# Patient Record
Sex: Female | Born: 1950 | Race: White | Marital: Single | Smoking: Never smoker
Health system: Southern US, Community
[De-identification: ages and names within clinical notes are randomized; demographics above are authoritative.]

## PROBLEM LIST (undated history)

## (undated) DIAGNOSIS — J45909 Unspecified asthma, uncomplicated: Secondary | ICD-10-CM

## (undated) HISTORY — DX: Unspecified asthma, uncomplicated: J45.909

## (undated) HISTORY — PX: APPENDECTOMY: SHX54

## (undated) HISTORY — PX: TONSILLECTOMY: SUR1361

## (undated) HISTORY — PX: OVARIAN CYST REMOVAL: SHX89

---

## 2001-01-31 HISTORY — PX: OTHER SURGICAL HISTORY: SHX169

## 2013-07-17 ENCOUNTER — Encounter: Payer: Self-pay | Admitting: Internal Medicine

## 2013-07-30 ENCOUNTER — Ambulatory Visit (AMBULATORY_SURGERY_CENTER): Payer: Medicare HMO

## 2013-07-30 VITALS — Ht 60.75 in | Wt 147.4 lb

## 2013-07-30 DIAGNOSIS — Z1211 Encounter for screening for malignant neoplasm of colon: Secondary | ICD-10-CM

## 2013-07-30 MED ORDER — MOVIPREP 100 G PO SOLR: 1.0000 | Freq: Once | ORAL | Status: DC

## 2013-07-30 NOTE — Progress Notes (Signed)
No allergies to eggs or soy No home oxygen No past problems with anesthesia No diet/weight loss meds  Has email but no internet access

## 2013-08-01 ENCOUNTER — Encounter: Payer: Self-pay | Admitting: Internal Medicine

## 2013-08-13 ENCOUNTER — Encounter: Payer: Self-pay | Admitting: Internal Medicine

## 2015-01-07 ENCOUNTER — Encounter: Payer: Self-pay | Admitting: Internal Medicine

## 2015-04-02 ENCOUNTER — Other Ambulatory Visit: Payer: Managed Care, Other (non HMO)

## 2015-04-02 ENCOUNTER — Encounter: Payer: Self-pay | Admitting: Internal Medicine

## 2015-04-02 ENCOUNTER — Ambulatory Visit (INDEPENDENT_AMBULATORY_CARE_PROVIDER_SITE_OTHER): Payer: Managed Care, Other (non HMO) | Admitting: Internal Medicine

## 2015-04-02 VITALS — BP 140/88 | HR 73 | Ht 61.0 in | Wt 156.2 lb

## 2015-04-02 DIAGNOSIS — J4540 Moderate persistent asthma, uncomplicated: Secondary | ICD-10-CM | POA: Diagnosis not present

## 2015-04-02 DIAGNOSIS — J329 Chronic sinusitis, unspecified: Secondary | ICD-10-CM | POA: Insufficient documentation

## 2015-04-02 DIAGNOSIS — R0989 Other specified symptoms and signs involving the circulatory and respiratory systems: Secondary | ICD-10-CM

## 2015-04-02 LAB — NITRIC OXIDE: NITRIC OXIDE: 15

## 2015-04-02 NOTE — Progress Notes (Signed)
Subjective:    Patient ID: Stacey Hill, female    DOB: [birth year], 65 y.o.   MRN: [id number]  PCP Hoyle Sauer, MD   HPI  IOV 04/02/2015  Chief Complaint  Patient presents with  . Pulmonary Consult    Pt referred by Dr. Felipa Eth for asthma. Pt states she had 2 episodes of bronchitis from April to June. Pt c/o DOE, mild dry cough, sinus congestion.      Stacey Hill is a 65 y.o. female originally from [place]. She has been referred for asthma that has a history of difficult control and recurrent exacerbations. She reports that she was born and brought up in [place] all her life. In the [place]. In her 84s she had acute episode of bronchitis and then had asthma for a few years. Then to her 30s and 40s she was asthma 3. Subsequently in 2000 30 2004 in her late 72s she had respiratory difficulty for several weeks which then resulted in a one-week emergency hospitalization for acute asthma. After that she was placed on triple inhaler therapy including Advair and Spiriva. However despite this she is to have recurrent asthma exacerbations at least requiring 3 episodes of prednisone each year and 405 antibiotic courses each year. But she never got hospitalized. All these were treated emergently through her primary care physician in [place]. Subsequently in 2012 she moved to [place] to live near her brother. She initially had some stretches without health insurance during which she did not take any of her maintenance inhalers but she felt her asthma was initially better. Subsequently she established with Dr. Felipa Eth and has been on Symbicort. However 2016 turned out to be a bad idea and she had exacerbations in April and November 2016. [facility]. I review those records and the exams clearly demonstrated she had bilateral significant wheezing. She was treated with antibiotic and prednisone each time. She now decided she wanted to see a pulmonologist.    Exhaled nitric oxide  today is 15. She says at this point in time she feels that her asthma is under control. She has associated chronic sinus drainage which she says for most people in the severe cold but for her is actually baseline and is normal and she is adapted to it. Asthma control 5. questionnaire today shows an average score cough 0.8 suggesting good control. Specifically she is not waking up in the middle of the night because of asthma. When she wakes up she only has mild symptoms. She is not limited at all in her activities because of asthma. She does get a very little shortness of breath with activities. She is not wheezing. Is not using albuterol for rescue.    Walking desaturation test 185 feet 3 laps on room air: Did not desaturate.    has a past medical history of Asthma.   reports that she has never smoked. She has never used smokeless tobacco.  Past Surgical History  Procedure Laterality Date  . Ovarian cyst removal  1967 1973    x2  . Torn tendon left ankle  2003    age 24  . Tonsillectomy    . Appendectomy      Allergies  Allergen Reactions  . Percocet [Oxycodone-Acetaminophen] Nausea And Vomiting    Immunization History  Administered Date(s) Administered  . Influenza,inj,Quad PF,36+ Mos 01/01/2015    Family History  Problem Relation Age of Onset  . Colon cancer Neg Hx   . Heart  disease Father      Current outpatient prescriptions:  .  albuterol (PROVENTIL HFA;VENTOLIN HFA) 108 (90 Base) MCG/ACT inhaler, Inhale 1-2 puffs into the lungs every 6 (six) hours as needed for wheezing or shortness of breath., Disp: , Rfl:  .  budesonide-formoterol (SYMBICORT) 160-4.5 MCG/ACT inhaler, Inhale 2 puffs into the lungs 2 (two) times daily., Disp: , Rfl:  .  diphenhydrAMINE (BENADRYL) 25 mg capsule, Take 25 mg by mouth every 6 (six) hours as needed., Disp: , Rfl:  .  fluticasone (FLONASE) 50 MCG/ACT nasal spray, Place 2 sprays into both nostrils daily., Disp: , Rfl:       Review of  Systems  Constitutional: Negative for fever and unexpected weight change.  HENT: Positive for congestion. Negative for dental problem, ear pain, nosebleeds, postnasal drip, rhinorrhea, sinus pressure, sneezing, sore throat and trouble swallowing.   Eyes: Negative for redness and itching.  Respiratory: Positive for cough and shortness of breath. Negative for chest tightness and wheezing.   Cardiovascular: Negative for palpitations and leg swelling.  Gastrointestinal: Negative for nausea and vomiting.  Genitourinary: Negative for dysuria.  Musculoskeletal: Negative for joint swelling.  Skin: Negative for rash.  Neurological: Negative for headaches.  Hematological: Does not bruise/bleed easily.  Psychiatric/Behavioral: Negative for dysphoric mood. The patient is not nervous/anxious.        Objective:   Physical Exam  Constitutional: She is oriented to person, place, and time. She appears well-developed and well-nourished. No distress.  HENT:  Head: Normocephalic and atraumatic.  Right Ear: External ear normal.  Left Ear: External ear normal.  Mouth/Throat: Oropharynx is clear and moist. No oropharyngeal exudate.  Eyes: Conjunctivae and EOM are normal. Pupils are equal, round, and reactive to light. Right eye exhibits no discharge. Left eye exhibits no discharge. No scleral icterus.  Neck: Normal range of motion. Neck supple. No JVD present. No tracheal deviation present. No thyromegaly present.  Cardiovascular: Normal rate, regular rhythm, normal heart sounds and intact distal pulses.  Exam reveals no gallop and no friction rub.   No murmur heard. Pulmonary/Chest: Effort normal. No respiratory distress. She has no wheezes. She has rales. She exhibits no tenderness.  She has definite bilateral bibasal crackles. This has not been described before  Abdominal: Soft. Bowel sounds are normal. She exhibits no distension and no mass. There is no tenderness. There is no rebound and no guarding.    Musculoskeletal: Normal range of motion. She exhibits no edema or tenderness.  Lymphadenopathy:    She has no cervical adenopathy.  Neurological: She is alert and oriented to person, place, and time. She has normal reflexes. No cranial nerve deficit. She exhibits normal muscle tone. Coordination normal.  Skin: Skin is warm and dry. No rash noted. She is not diaphoretic. No erythema. No pallor.  Psychiatric: She has a normal mood and affect. Her behavior is normal. Judgment and thought content normal.  Vitals reviewed.   Filed Vitals:   04/02/15 1555  BP: 140/88  Pulse: 73  Height:  (1.549 m)  Weight: 156 lb 3.2 oz (70.852 kg)  SpO2: 97%         Assessment & Plan:     ICD-9-CM ICD-10-CM   1. Asthma, moderate persistent, uncomplicated 493.90 J45.40   2. Chronic sinusitis, unspecified location 473.9 J32.9   3. Bibasilar crackles 786.7 R09.89    Clinically she has asthma. Based on today's asthma control question and exhaled nitric oxide this is under control on Symbicort. She has a history  of recurrent exacerbations. She also has a history of poor to control asthma. In this context, I would like to rule out ABPA, chronic sinus disease, allergy Asthma or alpha-1 antitrypsin deficiency or associated strong eosinophilia  In addition the crackles make me concerned that she might have underlying interstitial lung disease although the rest of the history does not fit in with this pattern  PLAN As of 04/02/2015 asthma appears well controlled However. Need to understand if you have asthma associated problems or risk factors for recurrent flare up  PLAN  - do alpha 1 AT genetic test  - do total IgE - do RAST blood allergy panel  - do cbc with diff  - do Aspergillus IgE and IgG - do HRCT wo contrast - do CT sinus wo contrast  FU  -return next few week after above  Dr. Kalman Shan, M.D., [facility].C.P Pulmonary and Critical Care Medicine Staff Physician [place]  [facility] [place] [facility] Pager: [phone], If no answer or between  15:00h - 7:00h: call [phone]  04/02/2015 4:47 PM

## 2015-04-02 NOTE — Patient Instructions (Signed)
ICD-9-CM ICD-10-CM   1. Asthma, moderate persistent, uncomplicated 493.90 J45.40   2. Chronic sinusitis, unspecified location 473.9 J32.9   3. Bibasilar crackles 786.7 R09.89     As of 04/02/2015 asthma appears well controlled However. Need to understand if you have asthma associated problems or risk factors for recurrent flare up  PLAN  - do alpha 1 AT genetic test  - do total IgE - do RAST blood allergy panel  - do cbc with diff  - do Aspergillus IgE and IgG - do HRCT wo contrast - do CT sinus wo contrast

## 2015-04-03 LAB — RESPIRATORY ALLERGY PROFILE REGION II ~~LOC~~
ALTERNARIA ALTERNATA: 0.47 kU/L — AB
Allergen, Cottonwood, t14: <0.1 kU/L
Allergen, D pternoyssinus,d7: <0.1 kU/L
Allergen, Mulberry, t76: <0.1 kU/L
Bermuda Grass: <0.1 kU/L
Cat Dander: 0.44 kU/L — ABNORMAL HIGH
Cladosporium Herbarum: <0.1 kU/L
Cockroach: <0.1 kU/L
D. farinae: <0.1 kU/L
Dog Dander: <0.1 kU/L
Elm IgE: <0.1 kU/L
IGE (IMMUNOGLOBULIN E), SERUM: 260 kU/L — AB (ref <115)
Johnson Grass: <0.1 kU/L

## 2015-04-06 ENCOUNTER — Telehealth: Payer: Self-pay | Admitting: Internal Medicine

## 2015-04-06 NOTE — Telephone Encounter (Signed)
I scheduled pt for her CT Maxillofacial and CT Chest High Resolution on 04/07/15.  However, when I tried to get the preauthorization from patient's insurance I was told that patient would need to contact them before they could give us any preauthorization information.  According to the insurance co, pt still had old demographics on file and they needed pt to call and update her info before they could determine if preauthorization was needed.  I spoke pt on 04/03/15 and advised that her insurance co needed her to contact them to update her demographics.  Patient stated she would contact them and call me back.  Later on 04/03/15 pt left a voice mail on my phone stating she wanted to cancel both of the CTs but did not state why. I spoke with Misty StanleyStacey @[facility]  and cancelled both CTs for the pt.   I called and spoke with the patient this morning (04/06/15) and asked if she would like for us to reschedule one or both CT's for her.  Pt stated she did not wish to reschedule either CT because there were issues involving her insurance co and that neither of these CTs was medically necessary anyway.

## 2015-04-07 ENCOUNTER — Inpatient Hospital Stay: Admission: RE | Admit: 2015-04-07 | Payer: Managed Care, Other (non HMO) | Source: Ambulatory Visit

## 2015-04-07 ENCOUNTER — Other Ambulatory Visit: Payer: Managed Care, Other (non HMO)

## 2015-04-07 LAB — ASPERGILLUS IGE PANEL
Aspergillus Amstel/glauc IgE: <0.35 kU/L (ref <0.35)
Aspergillus Terreus IgE: <0.35 kU/L (ref <0.35)
Aspergillus Versicolor IgE: <0.1 kU/L (ref <0.35)
Aspergillus flavus IgE: <0.35 kU/L (ref <0.35)
CLASS: 0
CLASS: 0
CLASS: 0
CLASS: 0
Class: 0
Class: 0
Class: 0

## 2015-04-08 LAB — ALPHA-1 ANTITRYPSIN PHENOTYPE: A-1 Antitrypsin: 132 mg/dL (ref 83–199)

## 2015-04-09 NOTE — Telephone Encounter (Addendum)
1) noted issues with CT scan - please tell her that we can manage without that data for now  2) blood work - all normal except mild alelrgies to cat dander and alternaria a fungi. This results in mild elevatin of IgE an alelrgy antibody  My rec - change appt to 3 months  - we will follow along - she is to involve us as first line contact for any resp issues  - over time we see repeated flare ups then will consider allergy referral v xolair - does she have a cat? How much does she love the cat?  Dr. Kalman ShanMurali Janah Mcculloh, M.D., [facility]C.P Pulmonary and Critical Care Medicine Staff Physician [place] [facility] [place] [facility] Pager: [phone], If no answer or between  15:00h - 7:00h: call [phone]  04/09/2015 3:24 PM

## 2015-04-13 NOTE — Telephone Encounter (Signed)
lmtcb for pt.  

## 2015-04-14 ENCOUNTER — Telehealth: Payer: Self-pay | Admitting: Internal Medicine

## 2015-04-14 NOTE — Telephone Encounter (Signed)
Patient is returning call, CB is [phone].

## 2015-04-14 NOTE — Telephone Encounter (Signed)
Per 3.14.17 pt has been informed. Will sign off.

## 2015-04-14 NOTE — Telephone Encounter (Signed)
See phone note 04/06/15.  Spoke with pt and advised of Dr Jane Canaryamaswamy's recommendations.  PT verbalized understanding.  Appt scheduled for 3 mth f/u.

## 2015-04-26 ENCOUNTER — Ambulatory Visit (INDEPENDENT_AMBULATORY_CARE_PROVIDER_SITE_OTHER): Payer: Managed Care, Other (non HMO) | Admitting: Physician Assistant

## 2015-04-26 ENCOUNTER — Ambulatory Visit (INDEPENDENT_AMBULATORY_CARE_PROVIDER_SITE_OTHER): Payer: Managed Care, Other (non HMO)

## 2015-04-26 VITALS — BP 108/78 | HR 87 | Temp 99.8°F | Resp 18 | Ht 61.5 in | Wt 151.0 lb

## 2015-04-26 DIAGNOSIS — R059 Cough, unspecified: Secondary | ICD-10-CM

## 2015-04-26 DIAGNOSIS — R05 Cough: Secondary | ICD-10-CM | POA: Diagnosis not present

## 2015-04-26 DIAGNOSIS — Z8709 Personal history of other diseases of the respiratory system: Secondary | ICD-10-CM

## 2015-04-26 LAB — POCT CBC
GRANULOCYTE PERCENT: 62.7 % (ref 37–80)
HEMATOCRIT: 42.9 % (ref 37.7–47.9)
Hemoglobin: 15.4 g/dL (ref 12.2–16.2)
Lymph, poc: 1.7 (ref 0.6–3.4)
MCH: 32.1 pg — AB (ref 27–31.2)
MCHC: 36 g/dL — AB (ref 31.8–35.4)
MCV: 89.3 fL (ref 80–97)
MID (CBC): 0.6 (ref 0–0.9)
MPV: 7.6 fL (ref 0–99.8)
POC GRANULOCYTE: 3.8 (ref 2–6.9)
POC LYMPH %: 27.7 % (ref 10–50)
POC MID %: 9.6 %M (ref 0–12)
Platelet Count, POC: 192 10*3/uL (ref 142–424)
RBC: 4.8 M/uL (ref 4.04–5.48)
RDW, POC: 13.8 %
WBC: 6 10*3/uL (ref 4.6–10.2)

## 2015-04-26 MED ORDER — PREDNISONE 20 MG PO TABS: ORAL_TABLET | ORAL | Status: AC

## 2015-04-26 NOTE — Patient Instructions (Signed)
     IF you received an x-ray today, you will receive an invoice from [facility]. Please contact [facility] at [phone] with questions or concerns regarding your invoice.   IF you received labwork today, you will receive an invoice from [facility]. Please contact [facility] at [phone] with questions or concerns regarding your invoice.   Our billing staff will not be able to assist you with questions regarding bills from these companies.  You will be contacted with the lab results as soon as they are available. The fastest way to get your results is to activate your My Chart account. Instructions are located on the last page of this paperwork. If you have not heard from us regarding the results in 2 weeks, please contact this office.      

## 2015-04-26 NOTE — Progress Notes (Signed)
04/26/2015 10:22 AM   DOB: 1950-10-26 / MRN: [id number]  SUBJECTIVE:  Stacey Hill is a 65 y.o. female with a history of asthma presenting for cough that started yesterday.  Associates mild headache and back ache that started yesterday, both of which have resolved today. Reports one episode of rigor this morning, and she took Aleve and this resolved.     She is allergic to percocet.   She  has a past medical history of Asthma.    She  reports that she has never smoked. She has never used smokeless tobacco. She reports that she drinks alcohol. She reports that she does not use illicit drugs. She  has no sexual activity history on file. The patient  has past surgical history that includes Ovarian cyst removal (1967 1973); torn tendon left ankle (2003); Tonsillectomy; and Appendectomy.  Her family history includes Heart disease in her father. There is no history of Colon cancer.  Review of Systems  Constitutional: Positive for chills and malaise/fatigue. Negative for fever and diaphoresis.  Respiratory: Negative for cough.   Cardiovascular: Negative for chest pain.  Gastrointestinal: Negative for nausea.  Skin: Negative for rash.  Neurological: Negative for dizziness and headaches.    Problem list and medications reviewed and updated by myself where necessary, and exist elsewhere in the encounter.   OBJECTIVE:  BP 108/78 mmHg  Pulse 87  Temp(Src) 99.8 F (37.7 C)  Resp 18  Ht 5' 1.5" (1.562 m)  Wt 151 lb (68.493 kg)  BMI 28.07 kg/m2  SpO2 95%  Physical Exam  Constitutional: She is oriented to person, place, and time. She appears well-nourished. No distress.  Eyes: EOM are normal. Pupils are equal, round, and reactive to light.  Cardiovascular: Normal rate.   Pulmonary/Chest: Effort normal. She has no wheezes. She has no rales.  Abdominal: She exhibits no distension.  Musculoskeletal: Normal range of motion.  Neurological: She is alert and oriented to person, place, and time.  No cranial nerve deficit. Gait normal.  Skin: Skin is dry. She is not diaphoretic.  Psychiatric: She has a normal mood and affect.  Vitals reviewed.   Results for orders placed or performed in visit on 04/26/15 (from the past 72 hour(s))  POCT CBC     Status: Abnormal   Collection Time: 04/26/15  9:49 AM  Result Value Ref Range   WBC 6.0 4.6 - 10.2 K/uL   Lymph, poc 1.7 0.6 - 3.4   POC LYMPH PERCENT 27.7 10 - 50 %L   MID (cbc) 0.6 0 - 0.9   POC MID % 9.6 0 - 12 %M   POC Granulocyte 3.8 2 - 6.9   Granulocyte percent 62.7 37 - 80 %G   RBC 4.80 4.04 - 5.48 M/uL   Hemoglobin 15.4 12.2 - 16.2 g/dL   HCT, POC 47.8 29.5 - 47.9 %   MCV 89.3 80 - 97 fL   MCH, POC 32.1 (A) 27 - 31.2 pg   MCHC 36.0 (A) 31.8 - 35.4 g/dL   RDW, POC 62.1 %   Platelet Count, POC 192 142 - 424 K/uL   MPV 7.6 0 - 99.8 fL     Dg Chest 2 View  04/26/2015  CLINICAL DATA:  Cough, known asthma EXAM: CHEST  2 VIEW COMPARISON:  None. FINDINGS: Normal mediastinum and cardiac silhouette. Lungs are hyperinflated. Normal pulmonary vasculature. No evidence of effusion, infiltrate, or pneumothorax. No acute bony abnormality. IMPRESSION: Hyperinflated lungs.  No acute findings. Electronically Signed  By: Genevive BiStewart  Edmunds M.D.   On: 04/26/2015 10:10    ASSESSMENT AND PLAN  Aurea GraffJoan was seen today for cough.  Diagnoses and all orders for this visit:  Cough: Her HPI, exam and workup are reassuring.  This is most likely a resolving influenza.  Given her history of asthma will treat to reduce bronchospasm.  RTC in 24-48 hours if no improvement or symptoms change.  -     POCT CBC -     DG Chest 2 View; Future  History of asthma  Other orders -     predniSONE (DELTASONE) 20 MG tablet; Take 3 in the morning for 3 days, then 2 in the morning for 3 days, and then 1 in the morning for 3 days.    The patient was advised to call or return to clinic if she does not see an improvement in symptoms or to seek the care of the closest  emergency department if she worsens with the above plan.   Deliah BostonMichael Clark, MHS, PA-C [facility] 04/26/2015 10:22 AM

## 2015-06-22 ENCOUNTER — Ambulatory Visit (INDEPENDENT_AMBULATORY_CARE_PROVIDER_SITE_OTHER): Payer: Managed Care, Other (non HMO) | Admitting: Adult Health

## 2015-06-22 ENCOUNTER — Encounter: Payer: Self-pay | Admitting: Adult Health

## 2015-06-22 VITALS — BP 150/92 | HR 80 | Temp 98.5°F | Ht 61.0 in | Wt 153.0 lb

## 2015-06-22 DIAGNOSIS — J4541 Moderate persistent asthma with (acute) exacerbation: Secondary | ICD-10-CM | POA: Diagnosis not present

## 2015-06-22 MED ORDER — CEFDINIR 300 MG PO CAPS: 300.0000 mg | ORAL_CAPSULE | Freq: Two times a day (BID) | ORAL | Status: DC

## 2015-06-22 NOTE — Assessment & Plan Note (Signed)
Flare with bronchitis   Plan  Finish Prednisone taper .  Begin Omnicef 300mg  Twice daily  For 1 week. Take with food.  Mucinex DM Twice daily  As needed  Cough/congestion  Fluids and rest  Please contact office for sooner follow up if symptoms do not improve or worsen or seek emergency care   follow up Dr. Marchelle Gearingamaswamy next month and As needed

## 2015-06-22 NOTE — Progress Notes (Signed)
Subjective:    Patient ID: Stacey Hill, female    DOB: 1950-07-01, 65 y.o.   MRN: [id number]  HPI 65 yo female never smoker with asthma   06/22/2015 [facility]  Pt presents for a follow up from recent urgent care visit.  Seen on 5/19 for 1 week of cough, congestion, wheezing, SOB, chest tightness/congestion, prod cough with light yellow sticky mucus.  Denies any sinus pressure/drainage, fever, chills, nausea or vomiting.  [facility]19/15 . Given prednisone taper. She is feeling better but still congested.  Remains on Symbicort.     Past Medical History  Diagnosis Date  . Asthma    Current Outpatient Prescriptions on File Prior to Visit  Medication Sig Dispense Refill  . albuterol (PROVENTIL HFA;VENTOLIN HFA) 108 (90 Base) MCG/ACT inhaler Inhale 1-2 puffs into the lungs every 6 (six) hours as needed for wheezing or shortness of breath.    . budesonide-formoterol (SYMBICORT) 160-4.5 MCG/ACT inhaler Inhale 2 puffs into the lungs 2 (two) times daily.    . diphenhydrAMINE (BENADRYL) 25 mg capsule Take 25 mg by mouth every 6 (six) hours as needed.    . fluticasone (FLONASE) 50 MCG/ACT nasal spray Place 2 sprays into both nostrils daily.     No current facility-administered medications on file prior to visit.      Review of Systems Constitutional:   No  weight loss, night sweats,  Fevers, chills, fatigue, or  lassitude.  HEENT:   No headaches,  Difficulty swallowing,  Tooth/dental problems, or  Sore throat,                No sneezing, itching, ear ache, nasal congestion, post nasal drip,   CV:  No chest pain,  Orthopnea, PND, swelling in lower extremities, anasarca, dizziness, palpitations, syncope.   GI  No heartburn, indigestion, abdominal pain, nausea, vomiting, diarrhea, change in bowel habits, loss of appetite, bloody stools.   Resp: No shortness of breath with exertion or at rest.  No excess mucus, no productive cough,  No non-productive cough,  No  coughing up of blood.  No change in color of mucus.  No wheezing.  No chest wall deformity  Skin: no rash or lesions.  GU: no dysuria, change in color of urine, no urgency or frequency.  No flank pain, no hematuria   MS:  No joint pain or swelling.  No decreased range of motion.  No back pain.  Psych:  No change in mood or affect. No depression or anxiety.  No memory loss.         Objective:   Physical Exam  Filed Vitals:   06/22/15 0926  Pulse: 80  Temp: 98.5 F (36.9 C)  TempSrc: Oral  Height: 5\' 1"  (1.549 m)  Weight: 153 lb (69.4 kg)  SpO2: 92%    GEN: A/Ox3; pleasant , NAD, well nourished   HEENT:  [place]/AT,  EACs-clear, TMs-wnl, NOSE-clear, THROAT-clear, no lesions, no postnasal drip or exudate noted.   NECK:  Supple w/ fair ROM; no JVD; normal carotid impulses w/o bruits; no thyromegaly or nodules palpated; no lymphadenopathy.no stridor .  RESP  Few trace rhonchi and exp wheezing .no accessory muscle use, no dullness to percussion Speaking in full sentences   CARD:  RRR, no m/r/g  , no peripheral edema, pulses intact, no cyanosis or clubbing.  GI:   Soft & nt; nml bowel sounds; no organomegaly or masses detected.  Musco: Warm bil, no deformities or joint swelling noted.  Neuro: alert, no focal deficits noted.    Skin: Warm, no lesions or rashes  Stacey Marucci NP-C  [place] [facility]  06/22/2015      Assessment & Plan:

## 2015-06-22 NOTE — Patient Instructions (Signed)
Finish Prednisone taper .  Begin Omnicef 300mg  Twice daily  For 1 week. Take with food.  Mucinex DM Twice daily  As needed  Cough/congestion  Fluids and rest  Please contact office for sooner follow up if symptoms do not improve or worsen or seek emergency care   follow up Dr. Marchelle Gearingamaswamy next month and As needed

## 2015-06-23 ENCOUNTER — Telehealth: Payer: Self-pay | Admitting: Internal Medicine

## 2015-06-23 MED ORDER — DOXYCYCLINE HYCLATE 100 MG PO TABS: 100.0000 mg | ORAL_TABLET | Freq: Two times a day (BID) | ORAL | Status: DC

## 2015-06-23 MED ORDER — PREDNISONE 10 MG PO TABS: ORAL_TABLET | ORAL | Status: DC

## 2015-06-23 NOTE — Telephone Encounter (Signed)
Patient states that Zpak has never worked for her.  She is requesting a stronger antibiotic. Dr. Craige CottaSood, please advise.

## 2015-06-23 NOTE — Telephone Encounter (Signed)
Pt returning call.Stacey Hill ° °

## 2015-06-23 NOTE — Telephone Encounter (Signed)
Send doxycycline 100 mg bid, #14 with no refills.

## 2015-06-23 NOTE — Telephone Encounter (Signed)
Change from cefdinir to Zpak.    Send script for prednisone 10 mg daily >> 3 pills daily for 2 days, 2 pills daily for 2 days, 1 pill daily for 2 days.

## 2015-06-23 NOTE — Telephone Encounter (Signed)
Patient notified of Dr. Sood's recommendations. Rx sent to pharmacy. Nothing further needed.  

## 2015-06-23 NOTE — Telephone Encounter (Signed)
Patient states that she is not feeling any better.  She said that she is coughing a lot, coughing out thick yellow mucus, not as thick as it was before, but still thick.  She is taking Mucinex DM to try to loosen the mucus, she said that she is taking Cefdinir, it is giving her Diarrhea. Patient said that she has taken Levaquin several times and she doesn't want to take it again because she doesn't want to develop a resistance to Levaquin. She only has 2 Prednisone tablets left.  Patient says that she had to sleep in the chair because she had a choking sensation when she lays down. She is concerned because she is not better after 1 week.  Patient states that she is using a lot of her albuterol neb medication and will need a refill on this as well.   [facility] [address]  Allergies  Allergen Reactions  . Penicillins Other (See Comments)    Told as a child  . Percocet [Oxycodone-Acetaminophen] Nausea And Vomiting

## 2015-06-23 NOTE — Telephone Encounter (Signed)
lmomtcb for pt.  After speaking with pt, will send in rx.

## 2015-07-07 ENCOUNTER — Ambulatory Visit (INDEPENDENT_AMBULATORY_CARE_PROVIDER_SITE_OTHER): Payer: Managed Care, Other (non HMO) | Admitting: Internal Medicine

## 2015-07-07 ENCOUNTER — Encounter: Payer: Self-pay | Admitting: Internal Medicine

## 2015-07-07 VITALS — BP 124/78 | HR 80 | Ht 61.0 in | Wt 153.0 lb

## 2015-07-07 DIAGNOSIS — J4540 Moderate persistent asthma, uncomplicated: Secondary | ICD-10-CM | POA: Diagnosis not present

## 2015-07-07 NOTE — Progress Notes (Signed)
Subjective:     Patient ID: Stacey Hill, female   DOB: Jun 28, 1950, 65 y.o.   MRN: [id number]  HPI  PCP Hoyle Sauer, MD   HPI  IOV 04/02/2015  Chief Complaint  Patient presents with  . Pulmonary Consult    Pt referred by Dr. Felipa Eth for asthma. Pt states she had 2 episodes of bronchitis from April to June. Pt c/o DOE, mild dry cough, sinus congestion.      Stacey Hill is a 65 y.o. female originally from [place]. She has been referred for asthma that has a history of difficult control and recurrent exacerbations. She reports that she was born and brought up in [place] all her life. In the [place]. In her 9s she had acute episode of bronchitis and then had asthma for a few years. Then to her 30s and 40s she was asthma 3. Subsequently in 2000 30 2004 in her late 25s she had respiratory difficulty for several weeks which then resulted in a one-week emergency hospitalization for acute asthma. After that she was placed on triple inhaler therapy including Advair and Spiriva. However despite this she is to have recurrent asthma exacerbations at least requiring 3 episodes of prednisone each year and 405 antibiotic courses each year. But she never got hospitalized. All these were treated emergently through her primary care physician in [place]. Subsequently in 2012 she moved to [place] to live near her brother. She initially had some stretches without health insurance during which she did not take any of her maintenance inhalers but she felt her asthma was initially better. Subsequently she established with Dr. Felipa Eth and has been on Symbicort. However 2016 turned out to be a bad idea and she had exacerbations in April and November 2016. [facility]. I review those records and the exams clearly demonstrated she had bilateral significant wheezing. She was treated with antibiotic and prednisone each time. She now decided she wanted to see a pulmonologist.    Exhaled nitric oxide  today is 15. She says at this point in time she feels that her asthma is under control. She has associated chronic sinus drainage which she says for most people in the severe cold but for her is actually baseline and is normal and she is adapted to it. Asthma control 5. questionnaire today shows an average score cough 0.8 suggesting good control. Specifically she is not waking up in the middle of the night because of asthma. When she wakes up she only has mild symptoms. She is not limited at all in her activities because of asthma. She does get a very little shortness of breath with activities. She is not wheezing. Is not using albuterol for rescue.   Walking desaturation test 185 feet 3 laps on room air: Did not desaturate.  RECX o alpha 1 AT genetic test  - do total IgE - do RAST blood allergy panel  - do cbc with diff  - do Aspergillus IgE and IgG - do HRCT wo contrast - do CT sinus wo contrast   06/22/2015 Urgent Care  Pt presents for a follow up from recent urgent care visit.  Seen on 5/19 for 1 week of cough, congestion, wheezing, SOB, chest tightness/congestion, prod cough with light yellow sticky mucus.  Denies any sinus pressure/drainage, fever, chills, nausea or vomiting.  [facility]19/15 . Given prednisone taper. She is feeling better but still congested.  Remains on Symbicort.   REC abx/pred  OV 07/07/2015  Chief Complaint  Patient presents with  . Follow-up    Pt doing well since 5/22 acute OV with TP- does note she is still clearing her throat constantly, some PND, producing thick white/clear mucus.    Asthma follow-up possible other differential diagnoses such as irritable larynx syndrome  - Since last visit was end of May 2017 at asthma exacerbation which required changing antibiotics and prolonged prednisone. She says symptom severity was extremely high that resulted in urgent care visit. Currently back to baseline. She is on Symbicort for  which she does not have a co-pay but she is willing to try Brio. She still has a lot of clearing of the throat. . Not much nocturnal awakening no rescue albuterol use currently   Labs from March 2015 include normal nitric oxide, alpha-1 antitrypsin phenotype MM blood allergy panel essentially normal except for slight elevation in And her and slightly elevated IgE to 260. Eosinophilia was not tested with the lab  She could not undergo CT chest and sinus for insurance reasons but a chest x-ray on this time is just hyperinflated and clear.   has a past medical history of Asthma.   reports that she has never smoked. She has never used smokeless tobacco.  Past Surgical History  Procedure Laterality Date  . Ovarian cyst removal  1967 1973    x2  . Torn tendon left ankle  2003    age 70  . Tonsillectomy    . Appendectomy      Allergies  Allergen Reactions  . Cefdinir Diarrhea  . Penicillins Other (See Comments)    Told as a child  . Percocet [Oxycodone-Acetaminophen] Nausea And Vomiting    Immunization History  Administered Date(s) Administered  . Influenza,inj,Quad PF,36+ Mos 01/01/2015    Family History  Problem Relation Age of Onset  . Colon cancer Neg Hx   . Heart disease Father      Current outpatient prescriptions:  .  albuterol (ACCUNEB) 0.63 MG/3ML nebulizer solution, Take 1 ampule by nebulization every 6 (six) hours as needed for wheezing., Disp: , Rfl:  .  albuterol (PROVENTIL HFA;VENTOLIN HFA) 108 (90 Base) MCG/ACT inhaler, Inhale 1-2 puffs into the lungs every 6 (six) hours as needed for wheezing or shortness of breath., Disp: , Rfl:  .  budesonide-formoterol (SYMBICORT) 160-4.5 MCG/ACT inhaler, Inhale 2 puffs into the lungs 2 (two) times daily., Disp: , Rfl:  .  diphenhydrAMINE (BENADRYL) 25 mg capsule, Take 25 mg by mouth every 6 (six) hours as needed., Disp: , Rfl:  .  fluticasone (FLONASE) 50 MCG/ACT nasal spray, Place 2 sprays into both nostrils daily., Disp: ,  Rfl:      Review of Systems     Objective:   Physical Exam  Constitutional: She is oriented to person, place, and time. She appears well-developed and well-nourished. No distress.  HENT:  Head: Normocephalic and atraumatic.  Right Ear: External ear normal.  Left Ear: External ear normal.  Mouth/Throat: Oropharynx is clear and moist. No oropharyngeal exudate.  No thrush  Eyes: Conjunctivae and EOM are normal. Pupils are equal, round, and reactive to light. Right eye exhibits no discharge. Left eye exhibits no discharge. No scleral icterus.  Neck: Normal range of motion. Neck supple. No JVD present. No tracheal deviation present. No thyromegaly present.  Cardiovascular: Normal rate, regular rhythm, normal heart sounds and intact distal pulses.  Exam reveals no gallop and no friction rub.   No murmur heard. Pulmonary/Chest: Effort normal and  breath sounds normal. No respiratory distress. She has no wheezes. She has no rales. She exhibits no tenderness.  Abdominal: Soft. Bowel sounds are normal. She exhibits no distension and no mass. There is no tenderness. There is no rebound and no guarding.  Musculoskeletal: Normal range of motion. She exhibits no edema or tenderness.  Lymphadenopathy:    She has no cervical adenopathy.  Neurological: She is alert and oriented to person, place, and time. She has normal reflexes. No cranial nerve deficit. She exhibits normal muscle tone. Coordination normal.  Skin: Skin is warm and dry. No rash noted. She is not diaphoretic. No erythema. No pallor.  Psychiatric: She has a normal mood and affect. Her behavior is normal. Judgment and thought content normal.  Vitals reviewed.   Filed Vitals:   07/07/15 0922  BP: 124/78  Pulse: 80  Height: 5\' 1"  (1.549 m)  Weight: 153 lb (69.4 kg)  SpO2: 96%       Assessment:       ICD-9-CM ICD-10-CM   1. Asthma, moderate persistent, uncomplicated 493.90 J45.40         Plan:      Glad you're stable  after recent exacerbation  PLAN Change Symbicort to high-dose Brio ons daily  -If co-pay and issue l please call us back immediately Use albuterol as needed  Follow-up - 2 months or sooner if needed  Asthma control questionnaire and nitric oxide at follow-up  Nitric oxide to be considered especially during times of exacerbation to differentiate asthma from other etiologies     Dr. Kalman ShanMurali Tamarra Geiselman, M.D., [facility] Pulmonary and Critical Care Medicine Staff Physician [facility] [place] [facility] Critical [facility] Pager: [phone], If no answer or between  15:00h - 7:00h: call [phone]  07/07/2015 9:41 AM

## 2015-07-07 NOTE — Patient Instructions (Addendum)
ICD-9-CM ICD-10-CM   1. Asthma, moderate persistent, uncomplicated 493.90 J45.40    Glad you're stable after recent exacerbation  PLAN Change Symbicort to high-dose Brio ons daily  -If co-pay and issue l please call us back immediately Use albuterol as needed  Follow-up - 2 months or sooner if needed  Asthma control questionnaire and nitric oxide at follow-up  Nitric oxide to be considered especially during times of exacerbation to differentiate asthma from other etiologies

## 2015-09-16 ENCOUNTER — Encounter: Payer: Self-pay | Admitting: Internal Medicine

## 2015-09-16 ENCOUNTER — Ambulatory Visit (INDEPENDENT_AMBULATORY_CARE_PROVIDER_SITE_OTHER): Payer: Managed Care, Other (non HMO) | Admitting: Internal Medicine

## 2015-09-16 VITALS — BP 128/90 | HR 72 | Ht 61.0 in | Wt 160.0 lb

## 2015-09-16 DIAGNOSIS — J4540 Moderate persistent asthma, uncomplicated: Secondary | ICD-10-CM

## 2015-09-16 DIAGNOSIS — Z23 Encounter for immunization: Secondary | ICD-10-CM | POA: Diagnosis not present

## 2015-09-16 MED ORDER — FLUTICASONE FUROATE-VILANTEROL 200-25 MCG/INH IN AEPB: 1.0000 | INHALATION_SPRAY | Freq: Every day | RESPIRATORY_TRACT | 3 refills | Status: DC

## 2015-09-16 MED ORDER — FLUTICASONE FUROATE-VILANTEROL 200-25 MCG/INH IN AEPB: 1.0000 | INHALATION_SPRAY | Freq: Every day | RESPIRATORY_TRACT | 0 refills | Status: AC

## 2015-09-16 NOTE — Progress Notes (Signed)
Subjective:     Patient ID: Stacey Hill, female   DOB: [birth year], 65 y.o.   MRN: [id number]  HPI    PCP Hoyle SauerAVVA,RAVISANKAR R, MD   HPI  IOV 04/02/2015  Chief Complaint  Patient presents with  . Pulmonary Consult    Pt referred by Dr. Felipa EthAvva for asthma. Pt states she had 2 episodes of bronchitis from April to June. Pt c/o DOE, mild dry cough, sinus congestion.      Stacey Hill is a 65 y.o. female originally from [place]. She has been referred for asthma that has a history of difficult control and recurrent exacerbations. She reports that she was born and brought up in [place] all her life. In the [place]. In her 520s she had acute episode of bronchitis and then had asthma for a few years. Then to her 30s and 40s she was asthma 3. Subsequently in 2000 30 2004 in her late 7840s she had respiratory difficulty for several weeks which then resulted in a one-week emergency hospitalization for acute asthma. After that she was placed on triple inhaler therapy including Advair and Spiriva. However despite this she is to have recurrent asthma exacerbations at least requiring 3 episodes of prednisone each year and 405 antibiotic courses each year. But she never got hospitalized. All these were treated emergently through her primary care physician in [place]. Subsequently in 2012 [place] live near her brother. She initially had some stretches without health insurance during which she did not take any of her maintenance inhalers but she felt her asthma was initially better. Subsequently she established with Dr. Felipa EthAvva and has been on Symbicort. However 2016 turned out to be a bad idea and she had exacerbations in April and November 2016. [facility] I review those records and the exams clearly demonstrated she had bilateral significant wheezing. She was treated with antibiotic and prednisone each time. She now decided she wanted to see a pulmonologist.    Exhaled nitric  oxide today is 15. She says at this point in time she feels that her asthma is under control. She has associated chronic sinus drainage which she says for most people in the severe cold but for her is actually baseline and is normal and she is adapted to it. Asthma control 5. questionnaire today shows an average score cough 0.8 suggesting good control. Specifically she is not waking up in the middle of the night because of asthma. When she wakes up she only has mild symptoms. She is not limited at all in her activities because of asthma. She does get a very little shortness of breath with activities. She is not wheezing. Is not using albuterol for rescue.   Walking desaturation test 185 feet 3 laps on room air: Did not desaturate.  RECX o alpha 1 AT genetic test  - do total IgE - do RAST blood allergy panel  - do cbc with diff  - do Aspergillus IgE and IgG - do HRCT wo contrast - do CT sinus wo contrast   06/22/2015 Urgent Care  Pt presents for a follow up from recent urgent care visit.  Seen on 5/19 for 1 week of cough, congestion, wheezing, SOB, chest tightness/congestion, prod cough with light yellow sticky mucus.  Denies any sinus pressure/drainage, fever, chills, nausea or vomiting.  [facility]/15 . Given prednisone taper. She is feeling better but still congested.  Remains on Symbicort.  REC abx/pred    OV 07/07/2015  Chief Complaint  Patient presents with  . Follow-up    Pt doing well since 5/22 acute OV with TP- does note she is still clearing her throat constantly, some PND, producing thick white/clear mucus.    Asthma follow-up possible other differential diagnoses such as irritable larynx syndrome  - Since last visit was end of May 2017 at asthma exacerbation which required changing antibiotics and prolonged prednisone. She says symptom severity was extremely high that resulted in urgent care visit. Currently back to baseline. She is on  Symbicort for which she does not have a co-pay but she is willing to try Brio. She still has a lot of clearing of the throat. . Not much nocturnal awakening no rescue albuterol use currently   Labs from March 2015 include normal nitric oxide, alpha-1 antitrypsin phenotype MM blood allergy panel essentially normal except for slight elevation in And her and slightly elevated IgE to 260. Eosinophilia was not tested with the lab  She could not undergo CT chest and sinus for insurance reasons but a chest x-ray on this time is just hyperinflated and clear.   has a past medical history of Asthma.   OV 09/16/2015  Chief Complaint  Patient presents with  . Follow-up    Pt states she cannot tell the difference when taking the [facility] and Symbicort. Pt states she is back on Symbicort becuase she ran out of Breo samples. Pt states she is doing well. Pt c/o mild cough in morning, pt contributes this to seasonal allergies.     Follow-up moderate persistent asthma. She developed diarrhea with Omnicef in May 2017. Currently maintained on Symbicort although at last visit I gave her high-dose Brio which she says worked really well. But she ran out of it. She prefers the Brio. Asthma control questionnaire shows a score of 0.2 which shows excellent control. Specifically at night she does not wake up at asthma when she wakes up in the morning she has very mild symptoms. She is not limited in her activities because of asthma. There is no dyspnea. She does not wheeze. She is albuterol for rescue. She will have the flu shot. I notice that she's not had pneumonia shot. She told me that she had one several years ago but does not rememer when. Also she pointed out that in the future she prefers 12 day \ prednisone during flareups as opposed to shorter course  Immunization History  Administered Date(s) Administered  . Influenza,inj,Quad PF,36+ Mos 01/01/2015        has a past medical history of Asthma.   reports that  she has never smoked. She has never used smokeless tobacco.  Past Surgical History:  Procedure Laterality Date  . APPENDECTOMY    . OVARIAN CYST REMOVAL  1967 1973   x2  . TONSILLECTOMY    . torn tendon left ankle  2003   age 65    Allergies  Allergen Reactions  . Cefdinir Diarrhea  . Penicillins Other (See Comments)    Told as a child  . Percocet [Oxycodone-Acetaminophen] Nausea And Vomiting    Immunization History  Administered Date(s) Administered  . Influenza,inj,Quad PF,36+ Mos 01/01/2015    Family History  Problem Relation Age of Onset  . Colon cancer Neg Hx   . Heart disease Father      Current Outpatient Prescriptions:  .  albuterol (ACCUNEB) 0.63 MG/3ML nebulizer solution, Take 1 ampule by nebulization every 6 (six) hours as  needed for wheezing., Disp: , Rfl:  .  albuterol (PROVENTIL HFA;VENTOLIN HFA) 108 (90 Base) MCG/ACT inhaler, Inhale 1-2 puffs into the lungs every 6 (six) hours as needed for wheezing or shortness of breath., Disp: , Rfl:  .  budesonide-formoterol (SYMBICORT) 160-4.5 MCG/ACT inhaler, Inhale 2 puffs into the lungs 2 (two) times daily., Disp: , Rfl:  .  diphenhydrAMINE (BENADRYL) 25 mg capsule, Take 25 mg by mouth every 6 (six) hours as needed., Disp: , Rfl:  .  fluticasone (FLONASE) 50 MCG/ACT nasal spray, Place 2 sprays into both nostrils daily., Disp: , Rfl:    Review of Systems     Objective:   Physical Exam  Constitutional: She is oriented to person, place, and time. She appears well-developed and well-nourished. No distress.  HENT:  Head: Normocephalic and atraumatic.  Right Ear: External ear normal.  Left Ear: External ear normal.  Mouth/Throat: Oropharynx is clear and moist. No oropharyngeal exudate.  Eyes: Conjunctivae and EOM are normal. Pupils are equal, round, and reactive to light. Right eye exhibits no discharge. Left eye exhibits no discharge. No scleral icterus.  Neck: Normal range of motion. Neck supple. No JVD present.  No tracheal deviation present. No thyromegaly present.  Cardiovascular: Normal rate, regular rhythm, normal heart sounds and intact distal pulses.  Exam reveals no gallop and no friction rub.   No murmur heard. Pulmonary/Chest: Effort normal and breath sounds normal. No respiratory distress. She has no wheezes. She has no rales. She exhibits no tenderness.  Abdominal: Soft. Bowel sounds are normal. She exhibits no distension and no mass. There is no tenderness. There is no rebound and no guarding.  Musculoskeletal: Normal range of motion. She exhibits no edema or tenderness.  Lymphadenopathy:    She has no cervical adenopathy.  Neurological: She is alert and oriented to person, place, and time. She has normal reflexes. No cranial nerve deficit. She exhibits normal muscle tone. Coordination normal.  Skin: Skin is warm and dry. No rash noted. She is not diaphoretic. No erythema. No pallor.  Psychiatric: She has a normal mood and affect. Her behavior is normal. Judgment and thought content normal.  Vitals reviewed.     Vitals:   09/16/15 0901  BP: 128/90  Pulse: 72  SpO2: 97%  Weight: 160 lb (72.6 kg)  Height: 5\' 1"  (1.549 m)        Assessment:       ICD-9-CM ICD-10-CM   1. Asthma, moderate persistent, uncomplicated 493.90 J45.40        Plan:      Glad you're stable  PLAN high-dose Brio once daily  -take sample and script with 90 day refill Use albuterol as needed FLu shot in fall Please have ensure yu have pneumonia shot We can consider 12 day prednisone for future flare ups  Follow-up -3 months or sooner if needed  Asthma control questionnaire   Nitric oxide to be considered especially during times of exacerbation to differentiate asthma from other etiologies     Dr. Kalman Shan, M.D., [facility].C.P Pulmonary and Critical Care Medicine Staff Physician [facility] [place] Pulmonary and Critical Care Pager: [phone], If no answer or between  15:00h -  7:00h: call [phone]  09/16/2015 9:22 AM

## 2015-09-16 NOTE — Addendum Note (Signed)
Addended by: Sheran LuzEAST, Sham Alviar K on: 09/16/2015 05:33 PM   Modules accepted: Orders

## 2015-09-16 NOTE — Patient Instructions (Addendum)
ICD-9-CM ICD-10-CM   1. Asthma, moderate persistent, uncomplicated 493.90 J45.40    Glad you're stable  PLAN high-dose Brio once daily  -take sample and script with 90 day refill Use albuterol as needed FLu shot in fall Please have ensure yu have pneumonia shot We can consider 12 day prednisone for future flare ups  Follow-up -3 months or sooner if needed  Asthma control questionnaire   Nitric oxide to be considered especially during times of exacerbation to differentiate asthma from other etiologies

## 2015-10-06 ENCOUNTER — Telehealth: Payer: Self-pay | Admitting: Internal Medicine

## 2015-10-06 NOTE — Telephone Encounter (Signed)
Ok fine thanks.  

## 2015-10-06 NOTE — Telephone Encounter (Signed)
States that she was unable to get the [employer] Coupon off the Internet - out of pocket cost is going to be $150.  Pt states that she has a refill on file for Symbicort and also a coupon that she can get free medication(zero copay). Pt states that she refilled the Symbicort and is going to take this instead of the Breo. Pt asked that we let Dr Marchelle Gearingamaswamy know as Lorain ChildesFYI that she has changed her medication regimen back to the Symbicort. Pt has upcoming appt win November. Nothing further needed.

## 2015-12-09 ENCOUNTER — Ambulatory Visit (INDEPENDENT_AMBULATORY_CARE_PROVIDER_SITE_OTHER): Payer: Managed Care, Other (non HMO) | Admitting: Internal Medicine

## 2015-12-09 ENCOUNTER — Encounter: Payer: Self-pay | Admitting: Internal Medicine

## 2015-12-09 VITALS — BP 112/82 | HR 71 | Ht 61.0 in | Wt 163.8 lb

## 2015-12-09 DIAGNOSIS — J4540 Moderate persistent asthma, uncomplicated: Secondary | ICD-10-CM

## 2015-12-09 NOTE — Progress Notes (Signed)
Subjective:     Patient ID: Stacey EmeryJoan Stemm, female   DOB: December 30, 1950, 65 y.o.   MRN: [id number]  HPI    PCP Hoyle SauerAVVA,RAVISANKAR R, MD   HPI  IOV 04/02/2015  Chief Complaint  Patient presents with  . Pulmonary Consult    Pt referred by Dr. Felipa EthAvva for asthma. Pt states she had 2 episodes of bronchitis from April to June. Pt c/o DOE, mild dry cough, sinus congestion.      Stacey Hill is a 65 y.o. female originally from [place]. She has been referred for asthma that has a history of difficult control and recurrent exacerbations. She reports that she was born and brought up in [place] all her life. In the [place]. In her 720s she had acute episode of bronchitis and then had asthma for a few years. Then to her 30s and 40s she was asthma 3. Subsequently in 2000 30 2004 in her late 4040s she had respiratory difficulty for several weeks which then resulted in a one-week emergency hospitalization for acute asthma. After that she was placed on triple inhaler therapy including Advair and Spiriva. However despite this she is to have recurrent asthma exacerbations at least requiring 3 episodes of prednisone each year and 405 antibiotic courses each year. But she never got hospitalized. All these were treated emergently through her primary care physician in [place]. Subsequently in 2012 she moved to [place] to live near her brother. She initially had some stretches without health insurance during which she did not take any of her maintenance inhalers but she felt her asthma was initially better. Subsequently she established with Dr. Felipa EthAvva and has been on Symbicort. However 2016 turned out to be a bad idea and she had exacerbations in April and November 2016. She went to the [facility]. I review those records and the exams clearly demonstrated she had bilateral significant wheezing. She was treated with antibiotic and prednisone each time. She now decided she wanted to see a pulmonologist.    Exhaled nitric  oxide today is 15. She says at this point in time she feels that her asthma is under control. She has associated chronic sinus drainage which she says for most people in the severe cold but for her is actually baseline and is normal and she is adapted to it. Asthma control 5. questionnaire today shows an average score cough 0.8 suggesting good control. Specifically she is not waking up in the middle of the night because of asthma. When she wakes up she only has mild symptoms. She is not limited at all in her activities because of asthma. She does get a very little shortness of breath with activities. She is not wheezing. Is not using albuterol for rescue.   Walking desaturation test 185 feet 3 laps on room air: Did not desaturate.  RECX o alpha 1 AT genetic test  - do total IgE - do RAST blood allergy panel  - do cbc with diff  - do Aspergillus IgE and IgG - do HRCT wo contrast - do CT sinus wo contrast   06/22/2015 Urgent Care  Pt presents for a follow up from recent urgent care visit.  Seen on 5/19 for 1 week of cough, congestion, wheezing, SOB, chest tightness/congestion, prod cough with light yellow sticky mucus.  Denies any sinus pressure/drainage, fever, chills, nausea or vomiting.  [facility]/19[facility]15 . Given prednisone taper. She is feeling better but still congested.  Remains on Symbicort.  REC abx/pred    OV 07/07/2015  Chief Complaint  Patient presents with  . Follow-up    Pt doing well since 5/22 acute OV with TP- does note she is still clearing her throat constantly, some PND, producing thick white/clear mucus.    Asthma follow-up possible other differential diagnoses such as irritable larynx syndrome  - Since last visit was end of May 2017 at asthma exacerbation which required changing antibiotics and prolonged prednisone. She says symptom severity was extremely high that resulted in urgent care visit. Currently back to baseline. She is on  Symbicort for which she does not have a co-pay but she is willing to try Brio. She still has a lot of clearing of the throat. . Not much nocturnal awakening no rescue albuterol use currently   Labs from March 2015 include normal nitric oxide, alpha-1 antitrypsin phenotype MM blood allergy panel essentially normal except for slight elevation in And her and slightly elevated IgE to 260. Eosinophilia was not tested with the lab  She could not undergo CT chest and sinus for insurance reasons but a chest x-ray on this time is just hyperinflated and clear.   has a past medical history of Asthma.   OV 09/16/2015  Chief Complaint  Patient presents with  . Follow-up    Pt states she cannot tell the difference when taking the [facility] and Symbicort. Pt states she is back on Symbicort becuase she ran out of Breo samples. Pt states she is doing well. Pt c/o mild cough in morning, pt contributes this to seasonal allergies.     Follow-up moderate persistent asthma. She developed diarrhea with Omnicef in May 2017. Currently maintained on Symbicort although at last visit I gave her high-dose Brio which she says worked really well. But she ran out of it. She prefers the Brio. Asthma control questionnaire shows a score of 0.2 which shows excellent control. Specifically at night she does not wake up at asthma when she wakes up in the morning she has very mild symptoms. She is not limited in her activities because of asthma. There is no dyspnea. She does not wheeze. She is albuterol for rescue. She will have the flu shot. I notice that she's not had pneumonia shot. She told me that she had one several years ago but does not rememer when. Also she pointed out that in the future she prefers 12 day \ prednisone during flareups as opposed to shorter course   OV 12/09/2015  Chief Complaint  Patient presents with  . Follow-up    Pt states breathing is fine today. Pt states she had an asthma attack last thursday before  work. Pt states no congestion, tightness, and has occasional SOB. Pt states she coughs some w/ yellow mucus. Discuss nebulizer use.    FU Moderae persistent asthma - based on hx   OVerall well but went to [place] for grandaughter 2nd borthday 11/24/15 and picked up cold and cogh and last 2 weeks significantly symptomatic but says has been resolving ad nearly back to baseline without Rx .  ACQ score with sigfnicant symptom burden but she says this represents symptoms of a week ago and notpast 24h. ACQ 5 point score 2.4.This means waking up several times at night and when she gets up she has moderate symptoms and slightly ltd with activities because of asthma and notices a little dyspnea and wheezing a little of the time and using alb for rescue 1-2 times daily beyond daily scheduled ics/laba. Still she  feels she does not need abx or steroids due to her improvement  Nitric oxide 15ppb and normal 12/09/2015    has a past medical history of Asthma.   reports that she has never smoked. She has never used smokeless tobacco.  Past Surgical History:  Procedure Laterality Date  . APPENDECTOMY    . OVARIAN CYST REMOVAL  1967 1973   x2  . TONSILLECTOMY    . torn tendon left ankle  2003   age 65    Allergies  Allergen Reactions  . Cefdinir Diarrhea  . Penicillins Other (See Comments)    Told as a child  . Percocet [Oxycodone-Acetaminophen] Nausea And Vomiting    Immunization History  Administered Date(s) Administered  . Influenza Split 11/02/2015  . Influenza,inj,Quad PF,36+ Mos 01/01/2015  . Pneumococcal Conjugate-13 09/16/2015    Family History  Problem Relation Age of Onset  . Colon cancer Neg Hx   . Heart disease Father      Current Outpatient Prescriptions:  .  albuterol (ACCUNEB) 0.63 MG/3ML nebulizer solution, Take 1 ampule by nebulization every 6 (six) hours as needed for wheezing., Disp: , Rfl:  .  albuterol (PROVENTIL HFA;VENTOLIN HFA) 108 (90 Base) MCG/ACT inhaler, Inhale 1-2  puffs into the lungs every 6 (six) hours as needed for wheezing or shortness of breath., Disp: , Rfl:  .  budesonide-formoterol (SYMBICORT) 160-4.5 MCG/ACT inhaler, Inhale 2 puffs into the lungs 2 (two) times daily., Disp: , Rfl:  .  diphenhydrAMINE (BENADRYL) 25 mg capsule, Take 25 mg by mouth every 6 (six) hours as needed., Disp: , Rfl:  .  fluticasone (FLONASE) 50 MCG/ACT nasal spray, Place 2 sprays into both nostrils daily., Disp: , Rfl:  .  fluticasone furoate-vilanterol (BREO ELLIPTA) 200-25 MCG/INH AEPB, Inhale 1 puff into the lungs daily., Disp: 3 each, Rfl: 3   Review of Systems     Objective:   Physical Exam  Constitutional: She is oriented to person, place, and time. She appears well-developed and well-nourished. No distress.  HENT:  Head: Normocephalic and atraumatic.  Right Ear: External ear normal.  Left Ear: External ear normal.  Mouth/Throat: Oropharynx is clear and moist. No oropharyngeal exudate.  Eyes: Conjunctivae and EOM are normal. Pupils are equal, round, and reactive to light. Right eye exhibits no discharge. Left eye exhibits no discharge. No scleral icterus.  Neck: Normal range of motion. Neck supple. No JVD present. No tracheal deviation present. No thyromegaly present.  Cardiovascular: Normal rate, regular rhythm, normal heart sounds and intact distal pulses.  Exam reveals no gallop and no friction rub.   No murmur heard. Pulmonary/Chest: Effort normal and breath sounds normal. No respiratory distress. She has no wheezes. She has no rales. She exhibits no tenderness.  No wheeze ? Rales at base  Abdominal: Soft. Bowel sounds are normal. She exhibits no distension and no mass. There is no tenderness. There is no rebound and no guarding.  Musculoskeletal: Normal range of motion. She exhibits no edema or tenderness.  Lymphadenopathy:    She has no cervical adenopathy.  Neurological: She is alert and oriented to person, place, and time. She has normal reflexes. No  cranial nerve deficit. She exhibits normal muscle tone. Coordination normal.  Skin: Skin is warm and dry. No rash noted. She is not diaphoretic. No erythema. No pallor.  Psychiatric: She has a normal mood and affect. Her behavior is normal. Judgment and thought content normal.  Vitals reviewed.    Vitals:   12/09/15 0910 12/09/15 [id number]  BP:  112/82  Pulse:  71  SpO2:  97%  Weight: 163 lb 12.8 oz (74.3 kg)   Height: 5\' 1"  (1.549 m)    Estimated body mass index is 30.95 kg/m as calculated from the following:   Height as of this encounter: 5\' 1"  (1.549 m).   Weight as of this encounter: 163 lb 12.8 oz (74.3 kg).       Assessment:       ICD-9-CM ICD-10-CM   1. Moderate persistent asthma, unspecified whether complicated 493.90 J45.40         Plan:     Glad you're stable and improving from recent flare up Will hold off antibiotic or prdnisone  PLAN high-dose Brio once daily  -take sample and script with 90 day refill Use albuterol as needed We can consider 12 day prednisone for future flare ups  Follow-up -6 months or sooner if needed  Asthma control questionnaire   Nitric oxide to be considered especially during times of exacerbation to differentiate asthma from other etiologies     Dr. Kalman Shan, M.D., [facility].C.P Pulmonary and Critical Care Medicine Staff Physician [facility] Pager: [phone], If no answer or between  15:00h - 7:00h: call [phone]  12/09/2015 11:21 PM

## 2015-12-09 NOTE — Patient Instructions (Addendum)
ICD-9-CM ICD-10-CM   1. Asthma, moderate persistent, uncomplicated 493.90 J45.40    Glad you're stable and improving from recent flare up Will hold off antibiotic or prdnisone  PLAN high-dose Brio once daily  -take sample and script with 90 day refill Use albuterol as needed We can consider 12 day prednisone for future flare ups  Follow-up -6 months or sooner if needed  Asthma control questionnaire   Nitric oxide to be considered especially during times of exacerbation to differentiate asthma from other etiologies

## 2016-02-08 ENCOUNTER — Ambulatory Visit (INDEPENDENT_AMBULATORY_CARE_PROVIDER_SITE_OTHER): Payer: Managed Care, Other (non HMO) | Admitting: Internal Medicine

## 2016-02-08 ENCOUNTER — Encounter: Payer: Self-pay | Admitting: Adult Health

## 2016-02-08 ENCOUNTER — Encounter: Payer: Self-pay | Admitting: Internal Medicine

## 2016-02-08 VITALS — BP 126/86 | HR 71 | Ht 61.0 in | Wt 168.0 lb

## 2016-02-08 DIAGNOSIS — J4540 Moderate persistent asthma, uncomplicated: Secondary | ICD-10-CM

## 2016-02-08 LAB — NITRIC OXIDE: Nitric Oxide: 36

## 2016-02-08 MED ORDER — AZITHROMYCIN 250 MG PO TABS: ORAL_TABLET | ORAL | 0 refills | Status: DC

## 2016-02-08 MED ORDER — DOXYCYCLINE HYCLATE 100 MG PO TABS: 100.0000 mg | ORAL_TABLET | Freq: Two times a day (BID) | ORAL | 0 refills | Status: DC

## 2016-02-08 MED ORDER — PREDNISONE 10 MG PO TABS: ORAL_TABLET | ORAL | 0 refills | Status: DC

## 2016-02-08 NOTE — Progress Notes (Signed)
Subjective:     Patient ID: Stacey Hill, female   DOB: 1950/08/13,  .   MRN: [id number]   PCP Hoyle SauerAVVA,RAVISANKAR R, MD     OV 12/09/2015  Chief Complaint  Patient presents with  . Follow-up    Pt states breathing is fine today. Pt states she had an asthma attack last thursday before work. Pt states no congestion, tightness, and has occasional SOB. Pt states she coughs some w/ yellow mucus. Discuss nebulizer use.  FU Moderae persistent asthma - based on hx Overall well but went to [place] for grandaughter 2nd borthday 11/24/15 and picked up cold and cogh and last 2 weeks significantly symptomatic but says has been resolving ad nearly back to baseline without Rx .  ACQ score with sigfnicant symptom burden but she says this represents symptoms of a week ago and notpast 24h. ACQ 5 point score 2.4.This means waking up several times at night and when she gets up she has moderate symptoms and slightly ltd with activities because of asthma and notices a little dyspnea and wheezing a little of the time and using alb for rescue 1-2 times daily beyond daily scheduled ics/laba. Still she feels she does not need abx or steroids due to her improvement  Nitric oxide 15ppb and normal 12/09/2015 rec ill hold off antibiotic or prdnisone  PLAN high-dose Brio once daily  -take sample and script with 90 day refill Use albuterol as needed We can consider 12 day prednisone for future flare ups  Follow-up -6 months or sooner if needed  Asthma control questionnaire   Nitric oxide to be considered especially during times of exacerbation to differentiate asthma from other etiologies      02/08/2016 acute extended ov/Stacey Hill re:  Never smoker last completely free of cough 2012 with typical flare x one week  Chief Complaint  Patient presents with  . Acute Visit    Pt c/o increased cough for the past 7-10 days. Cough is prod with very minimal yellow sputum. She is also c/o chest tightness. She is having an achy feeling in  her legs for the past day.   every may and October x 5 years having flares of "bronchitis" so this flare started early Jan and unusual just with the timing but otherwise "just like all the others while miant on BREO and assoc with aching all over x no HA Better breathing and cough p saba hfa or neb  No obvious day to day or daytime variability or assoc excess/ purulent sputum or mucus plugs or hemoptysis or cp or chest tightness, subjective wheeze or overt sinus or hb symptoms. No unusual exp hx or h/o childhood pna/ asthma or knowledge of premature birth.  Sleeping ok without nocturnal  or early am exacerbation  of respiratory  c/o's or need for noct saba. Also denies any obvious fluctuation of symptoms with weather or environmental changes or other aggravating or alleviating factors except as outlined above   Current Medications, Allergies, Complete Past Medical History, Past Surgical History, Family History, and Social History were reviewed in [facility] electronic medical record.  ROS  The following are not active complaints unless bolded sore throat, dysphagia, dental problems, itching, sneezing,  nasal congestion or excess/ purulent secretions, ear ache,   fever, chills, sweats, unintended wt loss, classically pleuritic or exertional cp,  orthopnea pnd or leg swelling, presyncope, palpitations, abdominal pain, anorexia, nausea, vomiting, diarrhea  or change in bowel or bladder habits, change in stools or urine, dysuria,hematuria,  rash,  arthralgias, visual complaints, headache, numbness, weakness or ataxia or problems with walking or coordination,  change in mood/affect or memory.                        Objective:   Physical Exam   amb wf/ looks a little uncomfortable but not toxic   Wt Readings from Last 3 Encounters:  02/08/16 168 lb (76.2 kg)  12/09/15 163 lb 12.8 oz (74.3 kg)  09/16/15 160 lb (72.6 kg)    Vital signs reviewed - Note on arrival 02 sats  100% on RA     HEENT: nl dentition, turbinates, and oropharynx. Nl external ear canals without cough reflex   NECK :  without JVD/Nodes/TM/ nl carotid upstrokes bilaterally   LUNGS: no acc muscle use,  Nl contour chest  With cough on insp > exp    CV:  RRR  no s3 or murmur or increase in P2, nad no edema   ABD:  soft and nontender with nl inspiratory excursion in the supine position. No bruits or organomegaly appreciated, bowel sounds nl  MS:  Nl gait/ ext warm without deformities, calf tenderness, cyanosis or clubbing No obvious joint restrictions   SKIN: warm and dry without lesions    NEURO:  alert, approp, nl sensorium with  no motor or cerebellar deficits apparent.                  Plan:

## 2016-02-08 NOTE — Patient Instructions (Addendum)
Plan A = Automatic = symbicort 160 Take 2 puffs first thing in am and then another 2 puffs about 12 hours later.   Work on inhaler technique:  relax and gently blow all the way out then take a nice smooth deep breath back in, triggering the inhaler at same time you start breathing in.  Hold for up to 5 seconds if you can. Blow out thru nose. Rinse and gargle with water when done     Prednisone 10 mg take  4 each am x 2 days,   2 each am x 2 days,  1 each am x 2 days and stop   Doxycycline 100 mg twice daily x 7 days   Try prilosec otc 20mg   Take 30-60 min before first meal of the day and Pepcid ac (famotidine) 20 mg one @  bedtime until cough is completely gone for at least a week without the need for cough suppression      GERD (REFLUX)  is an extremely common cause of respiratory symptoms just like yours , many times with no obvious heartburn at all.    It can be treated with medication, but also with lifestyle changes including elevation of the head of your bed (ideally with 6 inch  bed blocks),  Smoking cessation, avoidance of late meals, excessive alcohol, and avoid fatty foods, chocolate, peppermint, colas, red wine, and acidic juices such as orange juice.  NO MINT OR MENTHOL PRODUCTS SO NO COUGH DROPS  USE SUGARLESS CANDY INSTEAD (Jolley ranchers or Stover's or Life Savers) or even ice chips will also do - the key is to swallow to prevent all throat clearing. NO OIL BASED VITAMINS - use powdered substitutes.      Plan B = Backup Only use your albuterol as a rescue medication to be used if you can't catch your breath by resting or doing a relaxed purse lip breathing pattern.  - The less you use it, the better it will work when you need it. - Ok to use the inhaler up to 2 puffs  every 4 hours if you must but call for appointment if use goes up over your usual need - Don't leave home without it !!  (think of it like the spare tire for your car)   Plan C = Crisis - only use your  albuterol nebulizer if you first try Plan B and it fails to help > ok to use the nebulizer up to every 4 hours but if start needing it regularly call for immediate appointment   Plan D = Doctor - call me if B and C not adequate  Plan E = ER - go to ER or call 911 if all else fails

## 2016-02-08 NOTE — Telephone Encounter (Signed)
Spoke with the pt over the phone. She has been scheduled to see MW at 9:45am. Nothing further was needed.

## 2016-02-14 NOTE — Assessment & Plan Note (Addendum)
02/08/2016  After extensive coaching HFA effectiveness =    75% > continue symbicort 160 2bid - FENO 02/08/2016  =   57 during flare reportedly while on breo 100 ?adherent   Not clear how much of this is asthma vs uacs or combination of both but the point here is that these syndromes freq co-exist with the same ddx:  DDX of  difficult airways management almost all start with A and  include Adherence, Ace Inhibitors, Acid Reflux, Active Sinus Disease, Alpha 1 Antitripsin deficiency, Anxiety masquerading as Airways dz,  ABPA,  Allergy(esp in young), Aspiration (esp in elderly), Adverse effects of meds,  Active smokers, A bunch of PE's (a small clot burden can't cause this syndrome unless there is already severe underlying pulm or vascular dz with poor reserve) plus two Bs  = Bronchiectasis and Beta blocker use..and one C= CHF   Adherence is always the initial "prime suspect" and is a multilayered concern that requires a "trust but verify" approach in every patient - starting with knowing how to use medications, especially inhalers, correctly, keeping up with refills and understanding the fundamental difference between maintenance and prns vs those medications only taken for a very short course and then stopped and not refilled.  - see hfa teaching   ? Adverse effects from dpi > change to hfa  Ics/laba with sama hfa and then saba neb prn -see avs for instructions unique to this ov listed in action plan format    ? Allergy/asthma suggested by feno > 50 and previous IgE 260 but only impressive RAST was for Cat which she says doesn't apply >>   So rx symb 160 approp/ Prednisone 10 mg take  4 each am x 2 days,   2 each am x 2 days,  1 each am x 2 days and stop    ? Acid (or non-acid) GERD > always difficult to exclude as up to 75% of pts in some series report no assoc GI/ Heartburn symptoms> rec max (24h)  acid suppression and diet restrictions/ reviewed and instructions given in writing.   ? ABPA > already  excluded by IgE profile   ? Anxiety > usually at the bottom of this list of usual suspects but should be  higher on this pt's based on H and P> Follow up per Primary Care planned      ? Active sinusitis/ bronchitis > doxy    I had an extended discussion with the patient reviewing all relevant studies completed to date and  lasting 25 minutes of a 40  minute acute  visit acutely ill pt not previously known to me   re  non-specific but potentially very serious resp symptoms of unknown etiology.  Each maintenance medication was reviewed in detail including most importantly the difference between maintenance and prns and under what circumstances the prns are to be triggered using an action plan format that is not reflected in the computer generated alphabetically organized AVS.    Please see AVS for specific instructions unique to this office visit that I personally wrote and verbalized to the the pt in detail and then reviewed with pt  by my nurse highlighting any  changes in therapy recommended at today's visit to their plan of care.

## 2016-04-08 ENCOUNTER — Encounter: Payer: Self-pay | Admitting: Vascular Surgery

## 2016-04-19 ENCOUNTER — Encounter: Payer: Managed Care, Other (non HMO) | Admitting: Vascular Surgery

## 2016-04-28 IMAGING — CR DG CHEST 2V
2 series · 2 of 2 positions shown · non-contrast
Comparison: None.

CLINICAL DATA: Cough, known asthma

EXAM:
CHEST  2 VIEW

[PA]
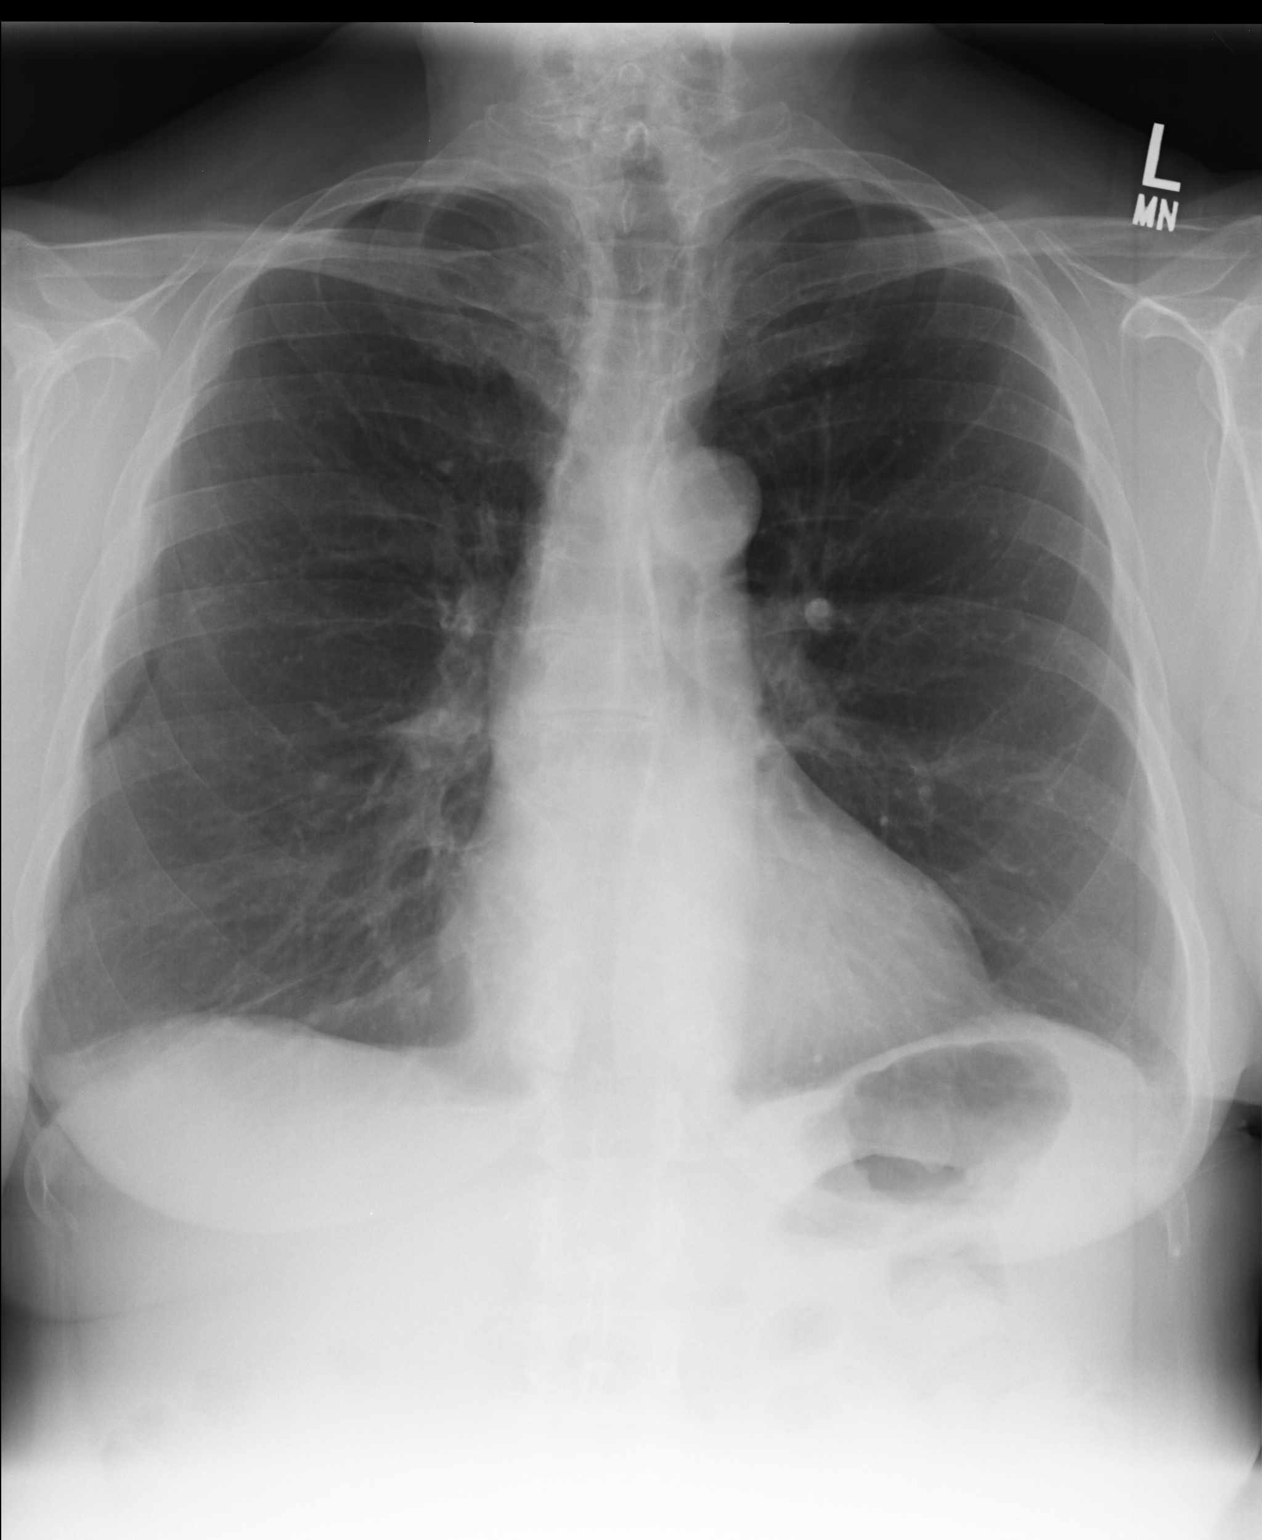

[lateral]
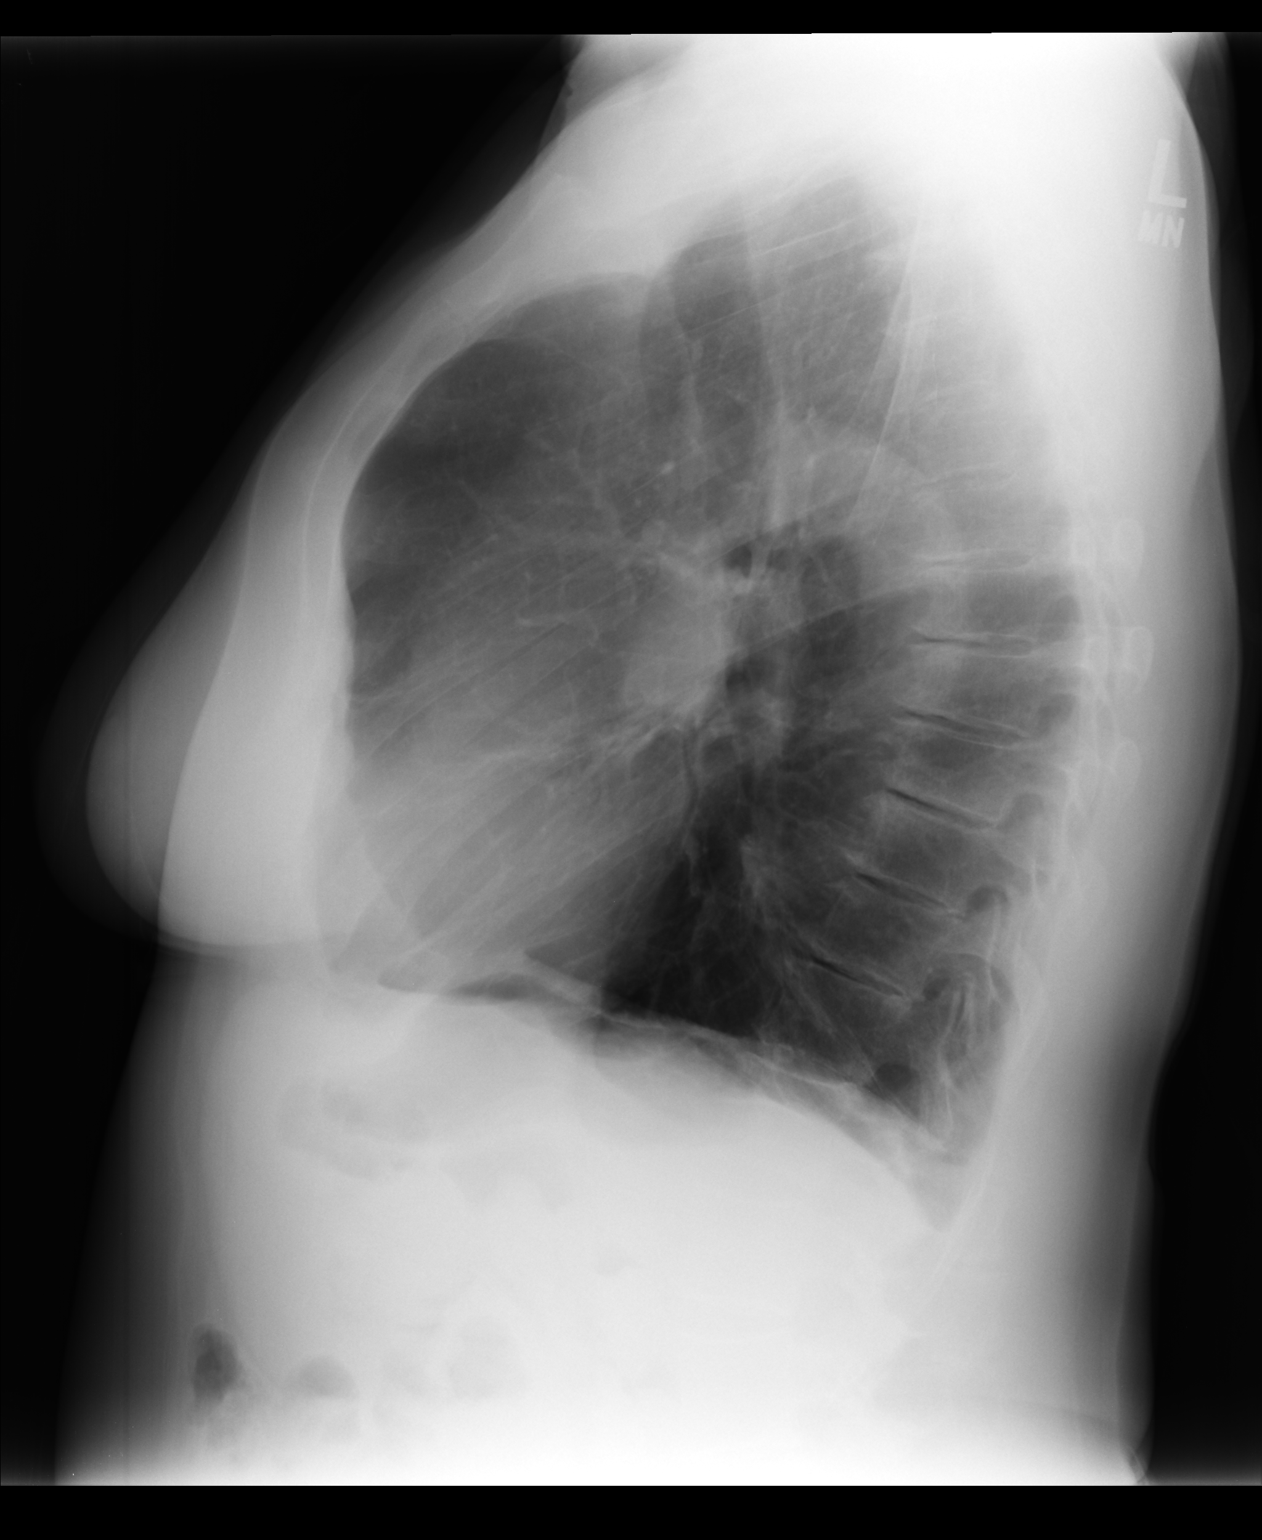

[2 of 2 positions shown; findings below may reference images not displayed]

FINDINGS: Normal mediastinum and cardiac silhouette. Lungs are hyperinflated.
Normal pulmonary vasculature. No evidence of effusion, infiltrate,
or pneumothorax. No acute bony abnormality.
IMPRESSION: Hyperinflated lungs.  No acute findings.

## 2016-06-15 ENCOUNTER — Ambulatory Visit (INDEPENDENT_AMBULATORY_CARE_PROVIDER_SITE_OTHER): Payer: 59 | Admitting: Internal Medicine

## 2016-06-15 ENCOUNTER — Encounter: Payer: Self-pay | Admitting: Internal Medicine

## 2016-06-15 DIAGNOSIS — I83893 Varicose veins of bilateral lower extremities with other complications: Secondary | ICD-10-CM

## 2016-06-15 DIAGNOSIS — J4540 Moderate persistent asthma, uncomplicated: Secondary | ICD-10-CM

## 2016-06-15 LAB — NITRIC OXIDE: NITRIC OXIDE: 22

## 2016-06-15 NOTE — Assessment & Plan Note (Signed)
I think is the problem you have  Plan Please have PCP Avva, Ravisankar, MD evaluate and Rx t his  Followup 9 months or sooner for asthma

## 2016-06-15 NOTE — Assessment & Plan Note (Addendum)
Glad you are better ASthma is under control  Plan - reduce symbicort to 160/4,5 - 1 puff twice daily for 1 month and then do 80/4,5 at 2 puff twice daily  - albuterol as needed -flu shot I fall  Followup 9 months or sooner if needed: ACQ at followup. FeNo depending on symptoms

## 2016-06-15 NOTE — Progress Notes (Signed)
Subjective:     Patient ID: Stacey Hill, female   DOB: [birth year], 66 y.o.   MRN: [id number]  HPI  OV 12/09/2015  Chief Complaint  Patient presents with  . Follow-up    Pt states breathing is fine today. Pt states she had an asthma attack last thursday before work. Pt states no congestion, tightness, and has occasional SOB. Pt states she coughs some w/ yellow mucus. Discuss nebulizer use.  FU Moderae persistent asthma - based on hx Overall well but went to [place] for grandaughter 2nd borthday 11/24/15 and picked up cold and cogh and last 2 weeks significantly symptomatic but says has been resolving ad nearly back to baseline without Rx .  ACQ score with sigfnicant symptom burden but she says this represents symptoms of a week ago and notpast 24h. ACQ 5 point score 2.4.This means waking up several times at night and when she gets up she has moderate symptoms and slightly ltd with activities because of asthma and notices a little dyspnea and wheezing a little of the time and using alb for rescue 1-2 times daily beyond daily scheduled ics/laba. Still she feels she does not need abx or steroids due to her improvement  Nitric oxide 15ppb and normal 12/09/2015 rec ill hold off antibiotic or prdnisone  PLAN high-dose Brio once daily  -take sample and script with 90 day refill Use albuterol as needed We can consider 12 day prednisone for future flare ups  Follow-up -6 months or sooner if needed  Asthma control questionnaire   Nitric oxide to be considered especially during times of exacerbation to differentiate asthma from other etiologies      02/08/2016 acute extended ov/Wert re:  Never smoker last completely free of cough 2012 with typical flare x one week  Chief Complaint  Patient presents with  . Acute Visit    Pt c/o increased cough for the past 7-10 days. Cough is prod with very minimal yellow sputum. She is also c/o chest tightness. She is having an achy feeling in her legs for the past  day.   every may and October x 5 years having flares of "bronchitis" so this flare started early Jan and unusual just with the timing but otherwise "just like all the others while miant on BREO and assoc with aching all over x no HA Better breathing and cough p saba hfa or neb  No obvious day to day or daytime variability or assoc excess/ purulent sputum or mucus plugs or hemoptysis or cp or chest tightness, subjective wheeze or overt sinus or hb symptoms. No unusual exp hx or h/o childhood pna/ asthma or knowledge of premature birth.  Sleeping ok without nocturnal  or early am exacerbation  of respiratory  c/o's or need for noct saba. Also denies any obvious fluctuation of symptoms with weather or environmental changes or other aggravating or alleviating factors except as outlined above    OV 06/15/2016  Chief Complaint  Patient presents with  . Follow-up    6 month follow up for asthma. States breathing has been great since last visit with Dr. Sherene Sires.     Follow-up moderate persistent asthma. Last seen January 2018 by Dr. Sherene Sires. Brio changed to Symbicort. She is currently feeling better. Symbicort was old inhaler. Currently asthma control questionnaire is 0.2 out of 5. She's not make an appointment middle of the night with symptoms. When she wakes up she has no symptoms. She is not limited at all in her activities because of  asthma. She does experience very little shortness of breath because of asthma. She's not had any wheezing. She uses albuterol for rescue very rarely. Exhaled nitric oxide today is normal. She feels the best ever  New issues that she has varicose veins and she has worsening pedal edema following long hours at work standing at [employer],   Asthma Control Panel Alpha 1 - MM 04/02/15 IgE - 260 04/02/15 Allergy panel - negative but for mild + cat dander 04/02/15 Eos - NA 04/02/15 02/08/16 06/15/2016   Current Med Regimen   symbicort 160  ACQ 5 point- 1 week. wtd avg score. <1.0 is good  control 0.75-1.25 is grey zone. >1.25 poor control. Delta 0.5 is clinically meaningful   0.2  FeNO ppB 15 36 v 56 22  FeV1      Planned intervention  for visit          has a past medical history of Asthma.   reports that she has never smoked. She has never used smokeless tobacco.  Past Surgical History:  Procedure Laterality Date  . APPENDECTOMY    . OVARIAN CYST REMOVAL  1967 1973   x2  . TONSILLECTOMY    . torn tendon left ankle  2003   age 66    Allergies  Allergen Reactions  . Cefdinir Diarrhea  . Penicillins Other (See Comments)    Told as a child  . Percocet [Oxycodone-Acetaminophen] Nausea And Vomiting    Immunization History  Administered Date(s) Administered  . Influenza Split 11/02/2015  . Influenza,inj,Quad PF,36+ Mos 01/01/2015  . Pneumococcal Conjugate-13 09/16/2015    Family History  Problem Relation Age of Onset  . Colon cancer Neg Hx   . Heart disease Father      Current Outpatient Prescriptions:  .  albuterol (ACCUNEB) 0.63 MG/3ML nebulizer solution, Take 1 ampule by nebulization every 6 (six) hours as needed for wheezing., Disp: , Rfl:  .  albuterol (PROVENTIL HFA;VENTOLIN HFA) 108 (90 Base) MCG/ACT inhaler, Inhale 1-2 puffs into the lungs every 6 (six) hours as needed for wheezing or shortness of breath., Disp: , Rfl:  .  budesonide-formoterol (SYMBICORT) 160-4.5 MCG/ACT inhaler, Inhale 2 puffs into the lungs 2 (two) times daily., Disp: , Rfl:  .  diphenhydrAMINE (BENADRYL) 25 mg capsule, Take 25 mg by mouth every 6 (six) hours as needed., Disp: , Rfl:  .  fluticasone (FLONASE) 50 MCG/ACT nasal spray, Place 2 sprays into both nostrils daily., Disp: , Rfl:    Review of Systems     Objective:   Physical Exam  Constitutional: She is oriented to person, place, and time. She appears well-developed and well-nourished. No distress.  HENT:  Head: Normocephalic and atraumatic.  Right Ear: External ear normal.  Left Ear: External ear normal.   Mouth/Throat: Oropharynx is clear and moist. No oropharyngeal exudate.  Eyes: Conjunctivae and EOM are normal. Pupils are equal, round, and reactive to light. Right eye exhibits no discharge. Left eye exhibits no discharge. No scleral icterus.  Neck: Normal range of motion. Neck supple. No JVD present. No tracheal deviation present. No thyromegaly present.  Cardiovascular: Normal rate, regular rhythm, normal heart sounds and intact distal pulses.  Exam reveals no gallop and no friction rub.   No murmur heard. Pulmonary/Chest: Effort normal and breath sounds normal. No respiratory distress. She has no wheezes. She has no rales. She exhibits no tenderness.  Abdominal: Soft. Bowel sounds are normal. She exhibits no distension and no mass. There is no tenderness. There  is no rebound and no guarding.  Musculoskeletal: Normal range of motion. She exhibits edema. She exhibits no tenderness.  Varicose veint +  Lymphadenopathy:    She has no cervical adenopathy.  Neurological: She is alert and oriented to person, place, and time. She has normal reflexes. No cranial nerve deficit. She exhibits normal muscle tone. Coordination normal.  Skin: Skin is warm and dry. No rash noted. She is not diaphoretic. No erythema. No pallor.  Psychiatric: She has a normal mood and affect. Her behavior is normal. Judgment and thought content normal.  Vitals reviewed.  Vitals:   06/15/16 0858  BP: 118/70  Pulse: 68  SpO2: 97%  Weight: 175 lb 9.6 oz (79.7 kg)  Height: 5\' 1"  (1.549 m)    Estimated body mass index is 33.18 kg/m as calculated from the following:   Height as of this encounter: 5\' 1"  (1.549 m).   Weight as of this encounter: 175 lb 9.6 oz (79.7 kg).     Assessment:       ICD-9-CM ICD-10-CM   1. Moderate persistent asthma without complication 493.90 J45.40 Nitric oxide  2. Varicose veins of both legs with edema 454.8 I83.893        Plan:     Moderate persistent asthma Glad you are  better ASthma is under control  Plan - reduce symbicort to 160/4,5 - 1 puff twice daily for 1 month and then do 80/4,5 at 2 puff twice daily  - albuterol as needed -flu shot I fall  Followup 9 months or sooner if needed: ACQ at followup. FeNo depending on symptoms  Varicose veins of both legs with edema I think is the problem you have  Plan Please have PCP Avva, Ravisankar, MD evaluate and Rx t his  Followup 9 months or sooner for asthma   Dr. Kalman Shan, M.D., [facility].C.P Pulmonary and Critical Care Medicine Staff Physician [facility] Pager: [phone], If no answer or between  15:00h - 7:00h: call [phone]  06/15/2016 9:27 AM

## 2016-06-15 NOTE — Patient Instructions (Signed)
Moderate persistent asthma Glad you are better ASthma is under control  Plan - reduce symbicort to 160/4,5 - 1 puff twice daily for 1 month and then do 80/4,5 at 2 puff twice daily  - albuterol as needed -flu shot I fall  Followup 9 months or sooner if needed: ACQ at followup. FeNo depending on symptoms  Varicose veins of both legs with edema I think is the problem you have  Plan Please have PCP Avva, Ravisankar, MD evaluate and Rx t his  Followup 9 months or sooner for asthma

## 2016-08-24 DIAGNOSIS — M859 Disorder of bone density and structure, unspecified: Secondary | ICD-10-CM | POA: Diagnosis not present

## 2016-08-24 DIAGNOSIS — Z0000 Encounter for general adult medical examination without abnormal findings: Secondary | ICD-10-CM | POA: Diagnosis not present

## 2016-08-24 DIAGNOSIS — E784 Other hyperlipidemia: Secondary | ICD-10-CM | POA: Diagnosis not present

## 2016-08-24 DIAGNOSIS — R7301 Impaired fasting glucose: Secondary | ICD-10-CM | POA: Diagnosis not present

## 2016-08-31 DIAGNOSIS — M859 Disorder of bone density and structure, unspecified: Secondary | ICD-10-CM | POA: Diagnosis not present

## 2016-08-31 DIAGNOSIS — J3089 Other allergic rhinitis: Secondary | ICD-10-CM | POA: Diagnosis not present

## 2016-08-31 DIAGNOSIS — M5380 Other specified dorsopathies, site unspecified: Secondary | ICD-10-CM | POA: Diagnosis not present

## 2016-08-31 DIAGNOSIS — J45909 Unspecified asthma, uncomplicated: Secondary | ICD-10-CM | POA: Diagnosis not present

## 2016-08-31 DIAGNOSIS — Z0000 Encounter for general adult medical examination without abnormal findings: Secondary | ICD-10-CM | POA: Diagnosis not present

## 2016-08-31 DIAGNOSIS — Z23 Encounter for immunization: Secondary | ICD-10-CM | POA: Diagnosis not present

## 2016-08-31 DIAGNOSIS — I872 Venous insufficiency (chronic) (peripheral): Secondary | ICD-10-CM | POA: Diagnosis not present

## 2016-08-31 DIAGNOSIS — N952 Postmenopausal atrophic vaginitis: Secondary | ICD-10-CM | POA: Diagnosis not present

## 2016-08-31 DIAGNOSIS — E784 Other hyperlipidemia: Secondary | ICD-10-CM | POA: Diagnosis not present

## 2016-08-31 DIAGNOSIS — Z6832 Body mass index (BMI) 32.0-32.9, adult: Secondary | ICD-10-CM | POA: Diagnosis not present

## 2016-08-31 DIAGNOSIS — R7301 Impaired fasting glucose: Secondary | ICD-10-CM | POA: Diagnosis not present

## 2016-08-31 DIAGNOSIS — Z1389 Encounter for screening for other disorder: Secondary | ICD-10-CM | POA: Diagnosis not present

## 2016-09-07 DIAGNOSIS — M859 Disorder of bone density and structure, unspecified: Secondary | ICD-10-CM | POA: Diagnosis not present

## 2016-09-12 DIAGNOSIS — Z1212 Encounter for screening for malignant neoplasm of rectum: Secondary | ICD-10-CM | POA: Diagnosis not present

## 2016-10-26 DIAGNOSIS — E559 Vitamin D deficiency, unspecified: Secondary | ICD-10-CM | POA: Diagnosis not present

## 2016-10-26 DIAGNOSIS — Z0131 Encounter for examination of blood pressure with abnormal findings: Secondary | ICD-10-CM | POA: Diagnosis not present

## 2016-10-26 DIAGNOSIS — Z6832 Body mass index (BMI) 32.0-32.9, adult: Secondary | ICD-10-CM | POA: Diagnosis not present

## 2016-10-26 DIAGNOSIS — M859 Disorder of bone density and structure, unspecified: Secondary | ICD-10-CM | POA: Diagnosis not present

## 2016-11-03 DIAGNOSIS — R030 Elevated blood-pressure reading, without diagnosis of hypertension: Secondary | ICD-10-CM | POA: Diagnosis not present

## 2016-11-08 DIAGNOSIS — R030 Elevated blood-pressure reading, without diagnosis of hypertension: Secondary | ICD-10-CM | POA: Diagnosis not present

## 2017-10-04 DIAGNOSIS — E7849 Other hyperlipidemia: Secondary | ICD-10-CM | POA: Diagnosis not present

## 2017-10-04 DIAGNOSIS — R7301 Impaired fasting glucose: Secondary | ICD-10-CM | POA: Diagnosis not present

## 2017-10-04 DIAGNOSIS — Z0000 Encounter for general adult medical examination without abnormal findings: Secondary | ICD-10-CM | POA: Diagnosis not present

## 2017-10-04 DIAGNOSIS — R82998 Other abnormal findings in urine: Secondary | ICD-10-CM | POA: Diagnosis not present

## 2017-10-04 DIAGNOSIS — E559 Vitamin D deficiency, unspecified: Secondary | ICD-10-CM | POA: Diagnosis not present

## 2017-10-11 DIAGNOSIS — Z1389 Encounter for screening for other disorder: Secondary | ICD-10-CM | POA: Diagnosis not present

## 2017-10-11 DIAGNOSIS — Z6831 Body mass index (BMI) 31.0-31.9, adult: Secondary | ICD-10-CM | POA: Diagnosis not present

## 2017-10-11 DIAGNOSIS — Z0000 Encounter for general adult medical examination without abnormal findings: Secondary | ICD-10-CM | POA: Diagnosis not present

## 2017-10-11 DIAGNOSIS — J45998 Other asthma: Secondary | ICD-10-CM | POA: Diagnosis not present

## 2017-10-11 DIAGNOSIS — J3089 Other allergic rhinitis: Secondary | ICD-10-CM | POA: Diagnosis not present

## 2017-10-11 DIAGNOSIS — R7301 Impaired fasting glucose: Secondary | ICD-10-CM | POA: Diagnosis not present

## 2017-10-11 DIAGNOSIS — M5380 Other specified dorsopathies, site unspecified: Secondary | ICD-10-CM | POA: Diagnosis not present

## 2017-10-11 DIAGNOSIS — I872 Venous insufficiency (chronic) (peripheral): Secondary | ICD-10-CM | POA: Diagnosis not present

## 2017-10-11 DIAGNOSIS — Z23 Encounter for immunization: Secondary | ICD-10-CM | POA: Diagnosis not present

## 2017-10-11 DIAGNOSIS — E559 Vitamin D deficiency, unspecified: Secondary | ICD-10-CM | POA: Diagnosis not present

## 2017-10-11 DIAGNOSIS — M859 Disorder of bone density and structure, unspecified: Secondary | ICD-10-CM | POA: Diagnosis not present

## 2017-10-11 DIAGNOSIS — R030 Elevated blood-pressure reading, without diagnosis of hypertension: Secondary | ICD-10-CM | POA: Diagnosis not present

## 2017-10-11 DIAGNOSIS — E7849 Other hyperlipidemia: Secondary | ICD-10-CM | POA: Diagnosis not present

## 2017-10-13 DIAGNOSIS — Z1212 Encounter for screening for malignant neoplasm of rectum: Secondary | ICD-10-CM | POA: Diagnosis not present

## 2017-12-29 DIAGNOSIS — R05 Cough: Secondary | ICD-10-CM | POA: Diagnosis not present

## 2017-12-29 DIAGNOSIS — R69 Illness, unspecified: Secondary | ICD-10-CM | POA: Diagnosis not present

## 2017-12-29 DIAGNOSIS — B370 Candidal stomatitis: Secondary | ICD-10-CM | POA: Diagnosis not present

## 2018-02-28 DIAGNOSIS — R05 Cough: Secondary | ICD-10-CM | POA: Diagnosis not present

## 2018-03-09 DIAGNOSIS — R05 Cough: Secondary | ICD-10-CM | POA: Diagnosis not present
